# Patient Record
Sex: Male | Born: 1935 | Race: White | Hispanic: No | Marital: Married | Smoking: Former smoker
Health system: Southern US, Community
[De-identification: ages and names within clinical notes are randomized; demographics above are authoritative.]

## PROBLEM LIST (undated history)

## (undated) DIAGNOSIS — M899 Disorder of bone, unspecified: Secondary | ICD-10-CM

## (undated) DIAGNOSIS — I10 Essential (primary) hypertension: Secondary | ICD-10-CM

## (undated) DIAGNOSIS — C3490 Malignant neoplasm of unspecified part of unspecified bronchus or lung: Secondary | ICD-10-CM

## (undated) DIAGNOSIS — K219 Gastro-esophageal reflux disease without esophagitis: Secondary | ICD-10-CM

## (undated) DIAGNOSIS — K5909 Other constipation: Secondary | ICD-10-CM

## (undated) HISTORY — DX: Other constipation: K59.09

## (undated) HISTORY — DX: Gastro-esophageal reflux disease without esophagitis: K21.9

## (undated) HISTORY — DX: Malignant neoplasm of unspecified part of unspecified bronchus or lung: C34.90

## (undated) HISTORY — DX: Disorder of bone, unspecified: M89.9

## (undated) HISTORY — DX: Essential (primary) hypertension: I10

---

## 2007-02-16 ENCOUNTER — Ambulatory Visit: Payer: Self-pay | Admitting: Unknown Physician Specialty

## 2008-01-30 ENCOUNTER — Ambulatory Visit: Payer: Self-pay

## 2008-02-29 ENCOUNTER — Ambulatory Visit: Payer: Self-pay | Admitting: Orthopedic Surgery

## 2008-02-29 ENCOUNTER — Other Ambulatory Visit: Payer: Self-pay

## 2008-03-05 ENCOUNTER — Ambulatory Visit: Payer: Self-pay | Admitting: Orthopedic Surgery

## 2008-09-30 ENCOUNTER — Ambulatory Visit: Payer: Self-pay | Admitting: Cardiology

## 2009-11-19 ENCOUNTER — Ambulatory Visit: Payer: Self-pay | Admitting: Ophthalmology

## 2009-11-22 ENCOUNTER — Ambulatory Visit: Payer: Self-pay

## 2009-12-04 ENCOUNTER — Ambulatory Visit: Payer: Self-pay

## 2009-12-23 ENCOUNTER — Ambulatory Visit: Payer: Self-pay

## 2010-02-22 ENCOUNTER — Ambulatory Visit: Payer: Self-pay

## 2010-03-09 ENCOUNTER — Ambulatory Visit: Payer: Self-pay

## 2010-03-19 ENCOUNTER — Ambulatory Visit: Payer: Self-pay

## 2010-03-25 ENCOUNTER — Ambulatory Visit: Payer: Self-pay

## 2010-07-07 ENCOUNTER — Ambulatory Visit: Payer: Self-pay | Admitting: Chiropractic Medicine

## 2010-09-23 ENCOUNTER — Ambulatory Visit: Payer: Self-pay | Admitting: Family Medicine

## 2010-09-25 ENCOUNTER — Ambulatory Visit: Payer: Self-pay | Admitting: Internal Medicine

## 2012-10-04 ENCOUNTER — Ambulatory Visit: Payer: Self-pay | Admitting: Unknown Physician Specialty

## 2013-11-02 ENCOUNTER — Emergency Department: Payer: Self-pay | Admitting: Emergency Medicine

## 2013-11-02 ENCOUNTER — Ambulatory Visit: Payer: Self-pay

## 2013-11-02 LAB — CBC WITH DIFFERENTIAL/PLATELET
Basophil #: 0 10*3/uL (ref 0.0–0.1)
Basophil %: 0.4 %
EOS PCT: 1.2 %
Eosinophil #: 0.1 10*3/uL (ref 0.0–0.7)
HCT: 45 % (ref 40.0–52.0)
HGB: 15.4 g/dL (ref 13.0–18.0)
LYMPHS PCT: 11.1 %
Lymphocyte #: 1.2 10*3/uL (ref 1.0–3.6)
MCH: 29.6 pg (ref 26.0–34.0)
MCHC: 34.3 g/dL (ref 32.0–36.0)
MCV: 86 fL (ref 80–100)
Monocyte #: 1.2 x10 3/mm — ABNORMAL HIGH (ref 0.2–1.0)
Monocyte %: 11.4 %
NEUTROS PCT: 75.9 %
Neutrophil #: 8 10*3/uL — ABNORMAL HIGH (ref 1.4–6.5)
PLATELETS: 267 10*3/uL (ref 150–440)
RBC: 5.21 10*6/uL (ref 4.40–5.90)
RDW: 13.2 % (ref 11.5–14.5)
WBC: 10.6 10*3/uL (ref 3.8–10.6)

## 2013-11-02 LAB — BASIC METABOLIC PANEL
Anion Gap: 10 (ref 7–16)
BUN: 17 mg/dL (ref 7–18)
CHLORIDE: 100 mmol/L (ref 98–107)
CREATININE: 0.99 mg/dL (ref 0.60–1.30)
Calcium, Total: 8.3 mg/dL — ABNORMAL LOW (ref 8.5–10.1)
Co2: 27 mmol/L (ref 21–32)
EGFR (African American): >60
EGFR (Non-African Amer.): >60
Glucose: 109 mg/dL — ABNORMAL HIGH (ref 65–99)
Osmolality: 276 (ref 275–301)
POTASSIUM: 3.4 mmol/L — AB (ref 3.5–5.1)
SODIUM: 137 mmol/L (ref 136–145)

## 2013-11-02 LAB — D-DIMER(ARMC): D-Dimer: 499 ng/ml

## 2013-11-02 LAB — TROPONIN I: Troponin-I: <0.02 ng/mL

## 2013-12-19 ENCOUNTER — Ambulatory Visit: Payer: Self-pay | Admitting: Unknown Physician Specialty

## 2013-12-20 ENCOUNTER — Ambulatory Visit: Payer: Self-pay | Admitting: Hematology and Oncology

## 2013-12-23 ENCOUNTER — Ambulatory Visit: Payer: Self-pay | Admitting: Hematology and Oncology

## 2013-12-23 LAB — COMPREHENSIVE METABOLIC PANEL
ALT: 22 U/L (ref 12–78)
AST: 9 U/L — AB (ref 15–37)
Albumin: 3.5 g/dL (ref 3.4–5.0)
Alkaline Phosphatase: 82 U/L
Anion Gap: 5 — ABNORMAL LOW (ref 7–16)
BILIRUBIN TOTAL: 0.5 mg/dL (ref 0.2–1.0)
BUN: 13 mg/dL (ref 7–18)
CO2: 32 mmol/L (ref 21–32)
Calcium, Total: 8.6 mg/dL (ref 8.5–10.1)
Chloride: 103 mmol/L (ref 98–107)
Creatinine: 0.95 mg/dL (ref 0.60–1.30)
Glucose: 97 mg/dL (ref 65–99)
Osmolality: 279 (ref 275–301)
Potassium: 4.1 mmol/L (ref 3.5–5.1)
Sodium: 140 mmol/L (ref 136–145)
TOTAL PROTEIN: 6.5 g/dL (ref 6.4–8.2)

## 2013-12-23 LAB — CBC CANCER CENTER
Basophil #: 0.1 x10 3/mm (ref 0.0–0.1)
Basophil %: 0.9 %
Eosinophil #: 0.3 x10 3/mm (ref 0.0–0.7)
Eosinophil %: 3.9 %
HCT: 44.2 % (ref 40.0–52.0)
HGB: 14.7 g/dL (ref 13.0–18.0)
LYMPHS PCT: 21.1 %
Lymphocyte #: 1.7 x10 3/mm (ref 1.0–3.6)
MCH: 29.4 pg (ref 26.0–34.0)
MCHC: 33.3 g/dL (ref 32.0–36.0)
MCV: 88 fL (ref 80–100)
Monocyte #: 0.7 x10 3/mm (ref 0.2–1.0)
Monocyte %: 8.9 %
NEUTROS ABS: 5.2 x10 3/mm (ref 1.4–6.5)
Neutrophil %: 65.2 %
PLATELETS: 332 x10 3/mm (ref 150–440)
RBC: 5.01 10*6/uL (ref 4.40–5.90)
RDW: 13.2 % (ref 11.5–14.5)
WBC: 8 x10 3/mm (ref 3.8–10.6)

## 2013-12-23 LAB — PROTIME-INR
INR: 1.1
Prothrombin Time: 13.6 secs (ref 11.5–14.7)

## 2013-12-23 LAB — LACTATE DEHYDROGENASE: LDH: 125 U/L (ref 85–241)

## 2013-12-24 LAB — PSA: PSA: 2.8 ng/mL (ref 0.0–4.0)

## 2013-12-30 DIAGNOSIS — M899 Disorder of bone, unspecified: Secondary | ICD-10-CM

## 2013-12-30 HISTORY — DX: Disorder of bone, unspecified: M89.9

## 2013-12-31 ENCOUNTER — Ambulatory Visit
Admission: RE | Admit: 2013-12-31 | Discharge: 2013-12-31 | Disposition: A | Discharge status: Home / Self Care | Payer: Medicare Other | Source: Ambulatory Visit | Attending: Cardiothoracic Surgery | Admitting: Cardiothoracic Surgery

## 2013-12-31 ENCOUNTER — Other Ambulatory Visit: Payer: Self-pay

## 2013-12-31 ENCOUNTER — Encounter: Payer: Self-pay | Admitting: Cardiothoracic Surgery

## 2013-12-31 ENCOUNTER — Institutional Professional Consult (permissible substitution) (INDEPENDENT_AMBULATORY_CARE_PROVIDER_SITE_OTHER): Payer: Medicare Other | Admitting: Cardiothoracic Surgery

## 2013-12-31 VITALS — BP 134/89 | HR 64 | Resp 16 | Ht 67.0 in | Wt 188.0 lb

## 2013-12-31 DIAGNOSIS — M899 Disorder of bone, unspecified: Secondary | ICD-10-CM

## 2013-12-31 DIAGNOSIS — I10 Essential (primary) hypertension: Secondary | ICD-10-CM

## 2013-12-31 DIAGNOSIS — I4891 Unspecified atrial fibrillation: Secondary | ICD-10-CM

## 2013-12-31 DIAGNOSIS — D381 Neoplasm of uncertain behavior of trachea, bronchus and lung: Secondary | ICD-10-CM

## 2013-12-31 DIAGNOSIS — M949 Disorder of cartilage, unspecified: Secondary | ICD-10-CM

## 2013-12-31 NOTE — Progress Notes (Signed)
[address]       [place] [address]             [phone]                    [name] Medical Record #[id number] Date of Birth: 1933/01/27  Referring: Nestor Lewandowsky, MD Primary Care: No primary provider on file.  Chief Complaint:    Chief Complaint  Patient presents with  . Lung Lesion    Surgical eval on left rib lesion, MRI 12/19/13    History of Present Illness:    Tanner Stone 80 y.o. male is seen in the office  today for a destructive lesion in the left seventh rib. The patient has noted vague discomfort intermittently over the left posterior rib for approximately a year. In February 2015 while splitting wood he had the sudden increase in the pain and did not resolve he was treated for "pulled muscle" for repair time without any improvement. He was seen in the [facility] emergency room for chest wall pain and gastric distress related to nonsteroidal anti-inflammatories he was taking. Because of continued pain an MRI of the thoracic spine was performed 3 weeks ago. The patient is referred to the office for consideration of further scanning and surgical treatment. The patient has had an 18pound weight loss since February. He is a nonsmoker does not use alcohol.      Current Activity/ Functional Status:  Patient is independent with mobility/ambulation, transfers, ADL's, IADL's.   Zubrod Score: At the time of surgery this patient's most appropriate activity status/level should be described as: [x]     0    Normal activity, no symptoms []     1    Restricted in physical strenuous activity but ambulatory, able to do out light work []     2    Ambulatory and capable of self care, unable to do work activities, up and about               >50 % of waking hours                              []     3    Only limited self care, in bed greater than 50% of waking hours []     4    Completely disabled, no self care, confined to bed or chair []     5     Moribund   Past Medical History  Diagnosis Date  . Hypertension   . Constipation, chronic   . GERD (gastroesophageal reflux disease)   . Rib lesion 12/30/2013   Surgical History:  Right shoulder surgery Appendectomy at age 28 Right inguinal hernia repair  Family History:  Patient's family history is significant for 3 children oldest on recently at age 47 had testicular cancer Patient's mother died of dementia at age 66 Father died at age 27 with melanoma A sister died of lung cancer at age 33 A brother died of colon cancer at age 25  History   Social History  . Marital Status: Married    Spouse Name: N/A    Number of Children: 3  . Years of Education: N/A   Occupational History  . Not on file.   Social History Main Topics  . Smoking status: Former Smoker -- 1.00 packs/day for 4 years    Types: Cigarettes  Quit date: 01/01/1960  . Smokeless tobacco: Never Used  . Alcohol Use: No  . Drug Use: No  .        History  Smoking status  . Former Smoker -- 1.00 packs/day for 4 years  . Types: Cigarettes  . Quit date: 01/01/1960  Smokeless tobacco  . Never Used    History  Alcohol Use No     Allergies  Allergen Reactions  . Dairy Aid [Lactase] Other (See Comments)    ALL Dairy Products/ GI distress    Current Outpatient Prescriptions  Medication Sig Dispense Refill  . aspirin EC 81 MG tablet Take 81 mg by mouth daily.      Marland Kitchen diltiazem (CARDIZEM SR) 120 MG 12 hr capsule Take 120 mg by mouth daily.      Marland Kitchen HYDROcodone-acetaminophen (NORCO/VICODIN) 5-325 MG per tablet Take 1 tablet by mouth every 6 (six) hours as needed for moderate pain.      Marland Kitchen losartan (COZAAR) 100 MG tablet Take 100 mg by mouth daily.      Marland Kitchen omeprazole (PRILOSEC) 20 MG capsule Take 20 mg by mouth daily.      Marland Kitchen senna-docusate (SENOKOT-S) 8.6-50 MG per tablet Take 1 tablet by mouth at bedtime.       No current facility-administered medications for this visit.     Review of Systems:       Cardiac Review of Systems: Y or N  Chest Pain [ y left chest wall   ]  Resting SOB [ n  ] Exertional SOB  [ n ]  Orthopnea [ n ]   Pedal Edema [n   ]    Palpitations [ y ] Syncope  [n  ]   Presyncope [n   ]  General Review of Systems: [Y] = yes [  ]=no Constitional: recent weight change Blue.Reese  ];  Wt loss over the last 3 months Roice.Felt   ] anorexia [  ]; fatigue [  ]; nausea [  ]; night sweats [  ]; fever [  ]; or chills [  ];          Dental: poor dentition[ y dentures ]; Last Dentist visit:   Eye : blurred vision [ n ]; diplopia [   ]; vision changes [  ];  Amaurosis fugax[  ]; Resp: cough [n  ];  wheezing[n  ];  hemoptysis[ n ]; shortness of breath[n  ]; paroxysmal nocturnal dyspnea[  ]; dyspnea on exertion[  ]; or orthopnea[  ];  GI:  gallstones[ n ], vomiting[n  ];  dysphagia[  ]; melena[  ];  hematochezia [  ]; heartburn[  ];   Hx of  Colonoscopy[ y ]; GU: kidney stones [  ]; hematuria[ n ];   dysuria [ n ];  nocturia[  ];  history of     obstruction [  ]; urinary frequency [ n ]             Skin: rash, swelling[  ];, hair loss[  ];  peripheral edema[  ];  or itching[  ]; Musculosketetal: myalgias[  ];  joint swelling[ n ];  joint erythema[ n ];  joint pain[  ];  back pain[y  ];  Heme/Lymph: bruising[n  ];  bleeding[  ];  anemia[  ];  Neuro: TIA[  ];  headaches[  ];  stroke[  ];  vertigo[  ];  seizures[  ];   paresthesias[  ];  difficulty walking[  ];  Psych:depression[  ];  anxiety[  ];  Endocrine: diabetes[  ];  thyroid dysfunction[  ];  Immunizations: Flu up to date [ y ]; Pneumococcal up to date Blue.Reese  ];  Other:  Physical Exam: BP 134/89  Pulse 64  Resp 16  Ht 5\' 7"  (1.702 m)  Wt 188 lb (85.276 kg)  BMI 29.44 kg/m2  SpO2 97%  PHYSICAL EXAMINATION:  General appearance: alert, cooperative, appears stated age and mild distress Neurologic: intact Heart: Irregularly irregular rate and rhythm, S1, S2 normal, no murmur, click, rub or gallop Lungs: clear to auscultation bilaterally and  normal percussion bilaterally Abdomen: soft, non-tender; bowel sounds normal; no masses,  no organomegaly Extremities: extremities normal, atraumatic, no cyanosis or edema and Homans sign is negative, no sign of DVT Mass palpable left posterior chest wall approximately 6-7 rib does not appear to involve the movement of the scapula   Diagnostic Studies & Laboratory data:     Recent Radiology Findings:   <HTML><META HTTP-EQUIV="content-type" CONTENT="text/html;charset=utf-8"><P>CLINICAL DATA: Mid back pain for 4 months.<BR> <BR>EXAM:<BR>MRI THORACIC SPINE WITHOUT CONTRAST<BR> <BR>TECHNIQUE:<BR>Multiplanar, multisequence MR imaging of the thoracic spine was<BR>performed. No intravenous contrast was administered.<BR> <BR>COMPARISON: None.<BR> <BR>FINDINGS:<BR>There is a larger lesion emanating from scratch there is a large<BR>lesion centered in the posterior left chest wall, likely the left<BR>sixth rib. The lesion measures 4.3 cm transverse by 4.0 cm AP by 7.6<BR>cm craniocaudal. The lesion is predominantly isointense to muscle on<BR>T1 weighted imaging with areas of intermediate and increased T2<BR>signal. No other focal bony lesion is identified. Small mucous<BR>retention cyst or polyp in the floor of the left maxillary sinus is<BR>incidentally noted.<BR> <BR>Vertebral body height and alignment are maintained. Degenerative<BR>endplate signal change is seen at L1-2. Bone marrow signal in the<BR>thoracic spine is otherwise unremarkable. The thoracic cord<BR>demonstrates normal signal throughout. The central spinal canal<BR>neural foramina appear open at all levels. Scattered very shallow<BR>disc bulges are also identified.<BR> <BR>IMPRESSION:<BR>Large destructive rib lesion on the left, likely centered in the<BR>seventh rib. Main differential considerations are chondrosarcoma<BR>versus metastatic disease. 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 Arlana Lindau obtain a CT scan of the chest following this decide whether to proceed with attempted needle biopsy versus primary resection. CT scan will be done today if possible I will see the patient back on Thursday.   I spent 55 minutes counseling the patient face to face. The total time spent in the appointment was 60 minutes.  Grace Isaac MD      [address] [place] [address] Office [phone]   Beeper [phone]  12/31/2013 1:57 PM

## 2014-01-01 ENCOUNTER — Encounter: Payer: Medicare Other | Admitting: Cardiothoracic Surgery

## 2014-01-01 ENCOUNTER — Other Ambulatory Visit: Payer: Self-pay

## 2014-01-01 DIAGNOSIS — D381 Neoplasm of uncertain behavior of trachea, bronchus and lung: Secondary | ICD-10-CM

## 2014-01-02 ENCOUNTER — Encounter: Payer: Self-pay | Admitting: Cardiothoracic Surgery

## 2014-01-02 ENCOUNTER — Other Ambulatory Visit: Payer: Self-pay | Admitting: *Deleted

## 2014-01-02 ENCOUNTER — Ambulatory Visit (INDEPENDENT_AMBULATORY_CARE_PROVIDER_SITE_OTHER): Payer: Medicare Other | Admitting: Cardiothoracic Surgery

## 2014-01-02 VITALS — BP 152/90 | HR 78 | Resp 20 | Ht 67.0 in | Wt 188.0 lb

## 2014-01-02 DIAGNOSIS — D381 Neoplasm of uncertain behavior of trachea, bronchus and lung: Secondary | ICD-10-CM

## 2014-01-02 DIAGNOSIS — M899 Disorder of bone, unspecified: Secondary | ICD-10-CM

## 2014-01-02 DIAGNOSIS — R918 Other nonspecific abnormal finding of lung field: Secondary | ICD-10-CM

## 2014-01-02 DIAGNOSIS — M949 Disorder of cartilage, unspecified: Secondary | ICD-10-CM

## 2014-01-02 DIAGNOSIS — C3490 Malignant neoplasm of unspecified part of unspecified bronchus or lung: Secondary | ICD-10-CM | POA: Insufficient documentation

## 2014-01-02 HISTORY — DX: Malignant neoplasm of unspecified part of unspecified bronchus or lung: C34.90

## 2014-01-02 NOTE — Progress Notes (Signed)
[address]       [place] [address]             [phone]                    Jaze M [facility] Medical Record #[id number] Date of Birth: [birth year]  Referring: Nestor Lewandowsky, MD Primary Care: No primary provider on file.  Chief Complaint:    Chief Complaint  Patient presents with  . Follow-up    Discuss Chest CT 12/31/13    History of Present Illness:    Tanner Stone 80 y.o. male is seen in the office  today for a destructive lesion in the left seventh rib. The patient has noted vague discomfort intermittently over the left posterior rib for approximately a year. In February 2015 while splitting wood he had the sudden increase in the pain and did not resolve he was treated for "pulled muscle" for repair time without any improvement. He was seen in the [facility] emergency room for chest wall pain and gastric distress related to nonsteroidal anti-inflammatories he was taking. Because of continued pain an MRI of the thoracic spine was performed 3 weeks ago. The patient is referred to the office for consideration of further scanning and surgical treatment. The patient has had an 18 pound weight loss since February. He is a nonsmoker does not use alcohol.   Since I saw the patient 2 days ago CT scan of the chest was performed and results noted below, the patient has the known destructive lesion of the left sixth/seventh rib. In addition lung nodules are noted.   Current Activity/ Functional Status:  Patient is independent with mobility/ambulation, transfers, ADL's, IADL's.   Zubrod Score: At the time of surgery this patient's most appropriate activity status/level should be described as: [x]     0    Normal activity, no symptoms []     1    Restricted in physical strenuous activity but ambulatory, able to do out light work []     2    Ambulatory and capable of self care, unable to do work activities, up and about               >50 % of waking hours                               []     3    Only limited self care, in bed greater than 50% of waking hours []     4    Completely disabled, no self care, confined to bed or chair []     5    Moribund   Past Medical History  Diagnosis Date  . Hypertension   . Constipation, chronic   . GERD (gastroesophageal reflux disease)   . Rib lesion 12/30/2013   Surgical History:  Right shoulder surgery Appendectomy at age 50 Right inguinal hernia repair  Family History:  Patient's family history is significant for 3 children oldest on recently at age 13 had testicular cancer Patient's mother died of dementia at age 67 Father died at age 33 with melanoma A sister died of lung cancer at age 31 A brother died of colon cancer at age 68  History   Social History  . Marital Status: Married    Spouse Name: N/A    Number of Children: 3  . Years of Education: N/A   Occupational  History  . Not on file.   Social History Main Topics  . Smoking status: Former Smoker -- 1.00 packs/day for 4 years    Types: Cigarettes    Quit date: 01/01/1960  . Smokeless tobacco: Never Used  . Alcohol Use: No  . Drug Use: No  .        History  Smoking status  . Former Smoker -- 1.00 packs/day for 4 years  . Types: Cigarettes  . Quit date: 01/01/1960  Smokeless tobacco  . Never Used    History  Alcohol Use No     Allergies  Allergen Reactions  . Dairy Aid [Lactase] Other (See Comments)    ALL Dairy Products/ GI distress    Current Outpatient Prescriptions  Medication Sig Dispense Refill  . aspirin EC 81 MG tablet Take 81 mg by mouth daily.      Marland Kitchen diltiazem (CARDIZEM SR) 120 MG 12 hr capsule Take 120 mg by mouth daily.      Marland Kitchen HYDROcodone-acetaminophen (NORCO/VICODIN) 5-325 MG per tablet Take 1 tablet by mouth every 6 (six) hours as needed for moderate pain.      Marland Kitchen losartan (COZAAR) 100 MG tablet Take 100 mg by mouth daily.      Marland Kitchen omeprazole (PRILOSEC) 20 MG capsule Take 20 mg by mouth daily.      Marland Kitchen  senna-docusate (SENOKOT-S) 8.6-50 MG per tablet Take 1 tablet by mouth at bedtime.       No current facility-administered medications for this visit.     Review of Systems:     Cardiac Review of Systems: Y or N  Chest Pain [ y left chest wall   ]  Resting SOB [ n  ] Exertional SOB  [ n ]  Orthopnea [ n ]   Pedal Edema [n   ]    Palpitations [ y ] Syncope  [n  ]   Presyncope [n   ]  General Review of Systems: [Y] = yes [  ]=no Constitional: recent weight change Blue.Reese  ];  Wt loss over the last 3 months Roice.Felt   ] anorexia [  ]; fatigue [  ]; nausea [  ]; night sweats [  ]; fever [  ]; or chills [  ];          Dental: poor dentition[ y dentures ]; Last Dentist visit:   Eye : blurred vision [ n ]; diplopia [   ]; vision changes [  ];  Amaurosis fugax[  ]; Resp: cough [n  ];  wheezing[n  ];  hemoptysis[ n ]; shortness of breath[n  ]; paroxysmal nocturnal dyspnea[  ]; dyspnea on exertion[  ]; or orthopnea[  ];  GI:  gallstones[ n ], vomiting[n  ];  dysphagia[  ]; melena[  ];  hematochezia [  ]; heartburn[  ];   Hx of  Colonoscopy[ y ]; GU: kidney stones [  ]; hematuria[ n ];   dysuria [ n ];  nocturia[  ];  history of     obstruction [  ]; urinary frequency [ n ]             Skin: rash, swelling[  ];, hair loss[  ];  peripheral edema[  ];  or itching[  ]; Musculosketetal: myalgias[  ];  joint swelling[ n ];  joint erythema[ n ];  joint pain[  ];  back pain[y  ];  Heme/Lymph: bruising[n  ];  bleeding[  ];  anemia[  ];  Neuro:  TIA[  ];  headaches[  ];  stroke[  ];  vertigo[  ];  seizures[  ];   paresthesias[  ];  difficulty walking[  ];  Psych:depression[  ]; anxiety[  ];  Endocrine: diabetes[  ];  thyroid dysfunction[  ];  Immunizations: Flu up to date [ y ]; Pneumococcal up to date Blue.Reese  ];  Other:  Physical Exam: BP 152/90  Pulse 78  Resp 20  Ht 5\' 7"  (1.702 m)  Wt 188 lb (85.276 kg)  BMI 29.44 kg/m2  SpO2 97%  PHYSICAL EXAMINATION:  General appearance: alert, cooperative, appears stated  age and mild distress Neurologic: intact Heart: Irregularly irregular rate and rhythm, S1, S2 normal, no murmur, click, rub or gallop Lungs: clear to auscultation bilaterally and normal percussion bilaterally Abdomen: soft, non-tender; bowel sounds normal; no masses,  no organomegaly Extremities: extremities normal, atraumatic, no cyanosis or edema and Homans sign is negative, no sign of DVT Mass palpable left posterior chest wall approximately 6-7 rib does not appear to involve the movement of the scapula   Diagnostic Studies & Laboratory data:     Recent Radiology Findings:  Ct Chest Wo Contrast  12/31/2013   CLINICAL DATA:  Left-sided rib sarcoma.  Followup.  Possible preop.  EXAM: CT CHEST WITHOUT CONTRAST  TECHNIQUE: Multidetector CT imaging of the chest was performed following the standard protocol without IV contrast.  COMPARISON:  MRI of 12/19/2013.  No prior chest CT.  FINDINGS: Lungs/Pleura: Lingular nodule which measures 1.0 x 1.0 cm on image 41/series 4 and sagittal image 133.  Minimal left major fissure oral thickening/ nodularity on image 32.  Superior segment left lower lobe volume loss adjacent to the rib process detailed below.  No pleural fluid.  Pleural involvement of the chest wall/rib lesion.  Separate area of mild left pleural thickening and nodularity on image 43/series 3 and less so on image 37/series 3.  Heart/Mediastinum: No supraclavicular adenopathy. Aortic and branch vessel atherosclerosis. Tortuous descending thoracic aorta. Mild cardiomegaly, without pericardial effusion.  No mediastinal or definite hilar adenopathy, given limitations of unenhanced CT.  Upper Abdomen: Normal noncontrast appearance of the imaged liver, spleen, stomach, pancreas, gallbladder, biliary tract, adrenal glands. Suspect left-sided renal sinus cysts. Lower pole left renal collecting system calculus.  Bones/Musculoskeletal: Soft tissue mass centered about the sixth and seventh posterior medial left  ribs. Better evaluated on recent MRI. Measures on the order of 4.1 x 5.4 cm on image 28/series 3. Concurrent sixth and seventh rib destruction or involvement.  Sclerotic lesion involving the fifth anterior left rib on image 34 is favored to represent a bone island.  IMPRESSION: 1. Left chest wall/rib based mass, consistent with the clinical history of sarcoma. 2. Isolated lingular nodule, suspicious for pulmonary metastasis. A synchronous primary bronchogenic carcinoma could look similar. Tissue sampling and/or PET should be considered. 3. Foci of pleural nodularity inferior to the chest wall/rib mass. Cannot exclude pleural metastasis. This would also be better evaluated with PET. 4. Left nephrolithiasis.   Electronically Signed   By: Abigail Miyamoto M.D.   On: 12/31/2013 16:05      <HTML><META HTTP-EQUIV="content-type" CONTENT="text/html;charset=utf-8"><P>CLINICAL DATA: Mid back pain for 4 months.<BR> <BR>EXAM:<BR>MRI THORACIC SPINE WITHOUT CONTRAST<BR> <BR>TECHNIQUE:<BR>Multiplanar, multisequence MR imaging of the thoracic spine was<BR>performed. No intravenous contrast was administered.<BR> <BR>COMPARISON: None.<BR> <BR>FINDINGS:<BR>There is a larger lesion emanating from scratch there is a large<BR>lesion centered in the posterior left chest wall, likely the left<BR>sixth rib. The lesion measures 4.3 cm transverse by 4.0 cm AP by  7.6<BR>cm craniocaudal. The lesion is predominantly isointense to muscle on<BR>T1 weighted imaging with areas of intermediate and increased T2<BR>signal. No other focal bony lesion is identified. Small mucous<BR>retention cyst or polyp in the floor of the left maxillary sinus is<BR>incidentally noted.<BR> <BR>Vertebral body height and alignment are maintained. Degenerative<BR>endplate signal change is seen at L1-2. Bone marrow signal in the<BR>thoracic spine is otherwise unremarkable. The thoracic cord<BR>demonstrates normal signal throughout. The central spinal canal<BR>neural  foramina appear open at all levels. Scattered very shallow<BR>disc bulges are also identified.<BR> <BR>IMPRESSION:<BR>Large destructive rib lesion on the left, likely centered in the<BR>seventh rib. Main differential considerations are chondrosarcoma<BR>versus metastatic disease. Metastatic disease is thought less<BR>likely.<BR> <BR>These results will be called to the ordering clinician or<BR>representative by the Radiologist Assistant, and communication<BR>documented in the PACS or zVision Dashboard.<BR> <BR> <BR>Electronically Signed<BR>By: Inge Rise M.D.<BR>On: 12/19/2013 08:55<BR></P>  Recent Lab Findings: No results found for this basename: WBC,  HGB,  HCT,  PLT,  GLUCOSE,  CHOL,  TRIG,  HDL,  LDLDIRECT,  LDLCALC,  ALT,  AST,  NA,  K,  CL,  CREATININE,  BUN,  CO2,  TSH,  INR,  GLUF,  HGBA1C      Assessment / Plan:   Left chest wall mass distractive seventh rib lesion 4 x 7.6 cm in size, 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 The final determination and this will be based on the PET scan June 15. Following a known pathologic diagnosis and stage further treatment modalities can be arranged.  Probable atrial fibrillation on physical exam-confirmation of the patient's rhythm and possible need for anticoagulation long term-patient's previously to see Dr. Kyra Searles  Tomorrow   Grace Isaac MD      [address] ,[place] [address] Office [phone]   Beeper [phone]  01/02/2014 12:26 PM

## 2014-01-06 ENCOUNTER — Ambulatory Visit: Payer: Self-pay | Admitting: Cardiothoracic Surgery

## 2014-01-08 ENCOUNTER — Other Ambulatory Visit: Payer: Self-pay | Admitting: Radiology

## 2014-01-09 ENCOUNTER — Encounter (HOSPITAL_COMMUNITY): Payer: Self-pay | Admitting: Pharmacist

## 2014-01-09 ENCOUNTER — Other Ambulatory Visit: Payer: Self-pay | Admitting: Radiology

## 2014-01-10 ENCOUNTER — Encounter (HOSPITAL_COMMUNITY): Payer: Self-pay

## 2014-01-10 ENCOUNTER — Ambulatory Visit (HOSPITAL_COMMUNITY)
Admission: RE | Admit: 2014-01-10 | Discharge: 2014-01-10 | Disposition: A | Discharge status: Home / Self Care | Payer: Medicare Other | Source: Ambulatory Visit | Attending: Cardiothoracic Surgery | Admitting: Cardiothoracic Surgery

## 2014-01-10 DIAGNOSIS — R911 Solitary pulmonary nodule: Secondary | ICD-10-CM | POA: Diagnosis not present

## 2014-01-10 DIAGNOSIS — R222 Localized swelling, mass and lump, trunk: Secondary | ICD-10-CM | POA: Diagnosis present

## 2014-01-10 DIAGNOSIS — R918 Other nonspecific abnormal finding of lung field: Secondary | ICD-10-CM

## 2014-01-10 LAB — PROTIME-INR
INR: 1.08 (ref 0.00–1.49)
PROTHROMBIN TIME: 13.8 s (ref 11.6–15.2)

## 2014-01-10 LAB — CBC
HEMATOCRIT: 46.2 % (ref 39.0–52.0)
Hemoglobin: 15.7 g/dL (ref 13.0–17.0)
MCH: 29.6 pg (ref 26.0–34.0)
MCHC: 34 g/dL (ref 30.0–36.0)
MCV: 87.2 fL (ref 78.0–100.0)
PLATELETS: 331 10*3/uL (ref 150–400)
RBC: 5.3 MIL/uL (ref 4.22–5.81)
RDW: 12.9 % (ref 11.5–15.5)
WBC: 11.8 10*3/uL — ABNORMAL HIGH (ref 4.0–10.5)

## 2014-01-10 LAB — APTT: APTT: 29 s (ref 24–37)

## 2014-01-10 MED ORDER — LIDOCAINE-EPINEPHRINE 1 %-1:100000 IJ SOLN
INTRAMUSCULAR | Status: AC
  Filled 2014-01-10: qty 1

## 2014-01-10 MED ORDER — FENTANYL CITRATE 0.05 MG/ML IJ SOLN
INTRAMUSCULAR | Status: AC
  Filled 2014-01-10: qty 2

## 2014-01-10 MED ORDER — MIDAZOLAM HCL 2 MG/2ML IJ SOLN
INTRAMUSCULAR | Status: AC
  Filled 2014-01-10: qty 4

## 2014-01-10 MED ORDER — SODIUM CHLORIDE 0.9 % IV SOLN
Freq: Once | INTRAVENOUS | Status: AC
  Administered 2014-01-10: 09:00:00 via INTRAVENOUS

## 2014-01-10 MED ORDER — FENTANYL CITRATE 0.05 MG/ML IJ SOLN
INTRAMUSCULAR | Status: AC | PRN
  Administered 2014-01-10: 25 ug via INTRAVENOUS
  Administered 2014-01-10: 50 ug via INTRAVENOUS

## 2014-01-10 MED ORDER — MIDAZOLAM HCL 2 MG/2ML IJ SOLN
INTRAMUSCULAR | Status: AC | PRN
Start: 2014-01-10 — End: 2014-01-10
  Administered 2014-01-10: 0.5 mg via INTRAVENOUS
  Administered 2014-01-10: 1 mg via INTRAVENOUS

## 2014-01-10 NOTE — H&P (Signed)
Tanner Stone is an 80 y.o. male.   Chief Complaint: Pt had noticed off and on L shoulder and back pain almost 1 yr Then 08/2013 while chopping wood noted new pain L shoulder Persistent even with medication After continual pain - pt did have MRI performed 3-4 weeks ago and referred to Dr Servando Snare. Further work up included CT scan which reveals L chest wall/rib based lesion and lingular nodule. PET at [facility] 01/06/14 has been reviewed by Dr Pascal Lux Also noted wt loss per pt. Now scheduled for L chest wall mass biopsy  HPI: HTN; GERD; wt loss  Past Medical History  Diagnosis Date  . Hypertension   . Constipation, chronic   . GERD (gastroesophageal reflux disease)   . Rib lesion 12/30/2013    History reviewed. No pertinent past surgical history.  No family history on file. Social History:  reports that he quit smoking about 54 years ago. His smoking use included Cigarettes. He has a 4 pack-year smoking history. He has never used smokeless tobacco. He reports that he does not drink alcohol or use illicit drugs.  Allergies:  Allergies  Allergen Reactions  . Dairy Aid [Lactase] Other (See Comments)    ALL Dairy Products/ GI distress     (Not in a hospital admission)  Results for orders placed during the hospital encounter of 01/10/14 (from the past 48 hour(s))  APTT     Status: None   Collection Time    01/10/14  9:19 AM      Result Value Ref Range   aPTT 29  24 - 37 seconds  CBC     Status: Abnormal   Collection Time    01/10/14  9:19 AM      Result Value Ref Range   WBC 11.8 (*) 4.0 - 10.5 K/uL   RBC 5.30  4.22 - 5.81 MIL/uL   Hemoglobin 15.7  13.0 - 17.0 g/dL   HCT 46.2  39.0 - 52.0 %   MCV 87.2  78.0 - 100.0 fL   MCH 29.6  26.0 - 34.0 pg   MCHC 34.0  30.0 - 36.0 g/dL   RDW 12.9  11.5 - 15.5 %   Platelets 331  150 - 400 K/uL  PROTIME-INR     Status: None   Collection Time    01/10/14  9:19 AM      Result Value Ref Range   Prothrombin Time 13.8  11.6 - 15.2  seconds   INR 1.08  0.00 - 1.49   No results found.  Review of Systems  Constitutional: Positive for weight loss. Negative for fever.  Respiratory: Negative for cough and shortness of breath.   Cardiovascular: Positive for chest pain.  Gastrointestinal: Negative for nausea, vomiting and abdominal pain.  Musculoskeletal: Positive for back pain.  Neurological: Negative for weakness and headaches.  Psychiatric/Behavioral: Negative for substance abuse.    Blood pressure 138/94, pulse 72, temperature 97.8 F (36.6 C), temperature source Oral, resp. rate 20, height 5\' 7"  (1.702 m), weight 84.823 kg (187 lb), SpO2 95.00%. Physical Exam  Constitutional: He is oriented to person, place, and time. He appears well-nourished.  Cardiovascular:  No murmur heard. Irregular  Respiratory: Effort normal and breath sounds normal. He has no wheezes.  GI: Soft. Bowel sounds are normal. There is no tenderness.  Musculoskeletal: Normal range of motion.  Neurological: He is alert and oriented to person, place, and time.  Skin: Skin is warm and dry.  Psychiatric: He has a normal  mood and affect. His behavior is normal. Judgment and thought content normal.     Assessment/Plan L chest wall/rib lesion mass Lingular mass Wt loss; L shoulder and back pain Now scheduled for chest wall mass bx Pt and wife aware of procedure benefits and risks and agreeable to proceed Consent signed and in chart   TURPIN,PAMELA A 01/10/2014, 10:32 AM

## 2014-01-10 NOTE — Sedation Documentation (Signed)
Patient denies pain and is resting comfortably.  

## 2014-01-10 NOTE — Discharge Instructions (Signed)
Needle Biopsy °Care After °These instructions give you information on caring for yourself after your procedure. Your doctor may also give you more specific instructions. Call your doctor if you have any problems or questions after your procedure. °HOME CARE °· Rest for 4 hours after your biopsy, except for getting up to go to the bathroom or as told. °· Keep the places where the needles were put in clean and dry. °¨ Do not put powder or lotion on the sites. °¨ Do not shower until 24 hours after the test. Remove all bandages (dressings) before showering. °¨ Remove all bandages at least once every day. Gently clean the sites with soap and water. Keep putting a new bandage on until the skin is closed. °Finding out the results of your test °Ask your doctor when your test results will be ready. Make sure you follow up and get the test results. °GET HELP RIGHT AWAY IF:  °· You have shortness of breath or trouble breathing. °· You have pain or cramping in your belly (abdomen). °· You feel sick to your stomach (nauseous) or throw up (vomit). °· Any of the places where the needles were put in: °¨ Are puffy (swollen) or red. °¨ Are sore or hot to the touch. °¨ Are draining yellowish-white fluid (pus). °¨ Are bleeding after 10 minutes of pressing down on the site. Have someone keep pressing on any place that is bleeding until you see a doctor. °· You have any unusual pain that will not stop. °· You have a fever. °If you go to the emergency room, tell the nurse that you had a biopsy. Take this paper with you to show the nurse. °MAKE SURE YOU:  °· Understand these instructions. °· Will watch your condition. °· Will get help right away if you are not doing well or get worse. °Document Released: 06/23/2008 Document Revised: 10/03/2011 Document Reviewed: 06/23/2008 °ExitCare® Patient Information ©2015 [employer]. This information is not intended to replace advice given to you by your health care provider. Make sure you discuss  any questions you have with your health care provider. ° °

## 2014-01-10 NOTE — Procedures (Signed)
Technically successful CT guided biopsy of expansile left posterior chest wall mass.  No immediate post procedural complications.

## 2014-01-13 ENCOUNTER — Other Ambulatory Visit (HOSPITAL_COMMUNITY): Payer: Medicare Other

## 2014-01-16 ENCOUNTER — Encounter: Payer: Self-pay | Admitting: *Deleted

## 2014-01-17 ENCOUNTER — Encounter: Payer: Self-pay | Admitting: *Deleted

## 2014-01-21 ENCOUNTER — Ambulatory Visit (INDEPENDENT_AMBULATORY_CARE_PROVIDER_SITE_OTHER): Payer: Medicare Other | Admitting: Cardiothoracic Surgery

## 2014-01-21 ENCOUNTER — Encounter: Payer: Self-pay | Admitting: Cardiothoracic Surgery

## 2014-01-21 VITALS — BP 140/90 | HR 60 | Resp 20 | Ht 67.0 in | Wt 187.0 lb

## 2014-01-21 DIAGNOSIS — Z9889 Other specified postprocedural states: Secondary | ICD-10-CM

## 2014-01-21 NOTE — Progress Notes (Signed)
[address]       [place] [address]             [phone]                    Finley M Dura Hancock Medical Record #[id number] Date of Birth: 1932-12-19  Referring: Nestor Lewandowsky, MD Primary Care: No primary provider on file.  Chief Complaint:    No chief complaint on file.   History of Present Illness:    Tanner Stone 80 y.o. male is seen in the office  today for a destructive lesion in the left seventh rib. The patient has noted vague discomfort intermittently over the left posterior rib for approximately a year. In February 2015 while splitting wood he had the sudden increase in the pain and did not resolve he was treated for "pulled muscle" for repair time without any improvement. He was seen in the [facility] emergency room for chest wall pain and gastric distress related to nonsteroidal anti-inflammatories he was taking. Because of continued pain an MRI of the thoracic spine was performed 3 weeks ago. The patient is referred to the office for consideration of further scanning and surgical treatment. The patient has had an 18 pound weight loss since February. He is a nonsmoker does not use alcohol.   Since patient was last seen a needle biopsy of the rib lesion has been performed and sent to the [facility] clinic for review [place] path number [phone] [phone]. 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  Current Activity/ Functional Status:  Patient is independent with mobility/ambulation, transfers, ADL's, IADL's.   Zubrod Score: At the time of surgery this patient's most appropriate activity status/level should be described as: [x]     0    Normal activity, no symptoms []     1    Restricted in physical strenuous activity but ambulatory, able to do out light work []     2    Ambulatory and capable of self care, unable  to do work activities, up and about               >50 % of waking hours                              []     3    Only limited self care, in bed greater than 50% of waking hours []     4    Completely disabled, no self care, confined to bed or chair []     5    Moribund   Past Medical History  Diagnosis Date  . Hypertension   . Constipation, chronic   . GERD (gastroesophageal reflux disease)   . Rib lesion 12/30/2013   Surgical History:  Right shoulder surgery Appendectomy at age 62 Right inguinal hernia repair  Family History:  Patient's family history is significant for 3 children oldest on recently at age 32 had testicular cancer Patient's mother died of dementia at age 97 Father died at age 40 with melanoma A sister died of lung cancer at age 40 A brother died of colon cancer at age 34  History   Social History  . Marital Status: Married    Spouse Name: N/A    Number of Children: 3  .  Years of Education: N/A   Occupational History  . Not on file.   Social History Main Topics  . Smoking status: Former Smoker -- 1.00 packs/day for 4 years    Types: Cigarettes    Quit date: 01/01/1960  . Smokeless tobacco: Never Used  . Alcohol Use: No  . Drug Use: No  .        History  Smoking status  . Former Smoker -- 1.00 packs/day for 4 years  . Types: Cigarettes  . Quit date: 01/01/1960  Smokeless tobacco  . Never Used    History  Alcohol Use No     Allergies  Allergen Reactions  . Dairy Aid [Lactase] Other (See Comments)    ALL Dairy Products/ GI distress    Current Outpatient Prescriptions  Medication Sig Dispense Refill  . aspirin EC 81 MG tablet Take 81 mg by mouth every morning.       . diltiazem (CARTIA XT) 120 MG 24 hr capsule Take 120 mg by mouth 2 (two) times daily.      Marland Kitchen HYDROcodone-acetaminophen (NORCO/VICODIN) 5-325 MG per tablet Take 1 tablet by mouth every 3 (three) hours. Max 8 tabs in a day      . losartan (COZAAR) 100 MG tablet Take 100 mg by  mouth every morning.       Marland Kitchen omeprazole (PRILOSEC) 20 MG capsule Take 20 mg by mouth every morning.       . senna-docusate (SENOKOT-S) 8.6-50 MG per tablet Take 2 tablets by mouth at bedtime.        No current facility-administered medications for this visit.     Review of Systems:     Cardiac Review of Systems: Y or N  Chest Pain [ y left chest wall   ]  Resting SOB [ n  ] Exertional SOB  [ n ]  Orthopnea [ n ]   Pedal Edema [n   ]    Palpitations [ y ] Syncope  [n  ]   Presyncope [n   ]  General Review of Systems: [Y] = yes [  ]=no Constitional: recent weight change Blue.Reese  ];  Wt loss over the last 3 months Roice.Felt   ] anorexia [  ]; fatigue [  ]; nausea [  ]; night sweats [  ]; fever [  ]; or chills [  ];          Dental: poor dentition[ y dentures ]; Last Dentist visit:   Eye : blurred vision [ n ]; diplopia [   ]; vision changes [  ];  Amaurosis fugax[  ]; Resp: cough [n  ];  wheezing[n  ];  hemoptysis[ n ]; shortness of breath[n  ]; paroxysmal nocturnal dyspnea[  ]; dyspnea on exertion[  ]; or orthopnea[  ];  GI:  gallstones[ n ], vomiting[n  ];  dysphagia[  ]; melena[  ];  hematochezia [  ]; heartburn[  ];   Hx of  Colonoscopy[ y ]; GU: kidney stones [  ]; hematuria[ n ];   dysuria [ n ];  nocturia[  ];  history of     obstruction [  ]; urinary frequency [ n ]             Skin: rash, swelling[  ];, hair loss[  ];  peripheral edema[  ];  or itching[  ]; Musculosketetal: myalgias[  ];  joint swelling[ n ];  joint erythema[ n ];  joint pain[  ];  back  pain[y  ];  Heme/Lymph: bruising[n  ];  bleeding[  ];  anemia[  ];  Neuro: TIA[  ];  headaches[  ];  stroke[  ];  vertigo[  ];  seizures[  ];   paresthesias[  ];  difficulty walking[  ];  Psych:depression[  ]; anxiety[  ];  Endocrine: diabetes[  ];  thyroid dysfunction[  ];  Immunizations: Flu up to date [ y ]; Pneumococcal up to date Blue.Reese  ];  Other:  Physical Exam: There were no vitals taken for this visit.  PHYSICAL EXAMINATION:  General  appearance: alert, cooperative, appears stated age and mild distress Neurologic: intact Heart: Irregularly irregular rate and rhythm, S1, S2 normal, no murmur, click, rub or gallop Lungs: clear to auscultation bilaterally and normal percussion bilaterally Abdomen: soft, non-tender; bowel sounds normal; no masses,  no organomegaly Extremities: extremities normal, atraumatic, no cyanosis or edema and Homans sign is negative, no sign of DVT Mass palpable left posterior chest wall approximately 6-7 rib does not appear to involve the movement of the scapula   Diagnostic Studies & Laboratory data:     Recent Radiology Findings:  Ct Chest Wo Contrast  12/31/2013   CLINICAL DATA:  Left-sided rib sarcoma.  Followup.  Possible preop.  EXAM: CT CHEST WITHOUT CONTRAST  TECHNIQUE: Multidetector CT imaging of the chest was performed following the standard protocol without IV contrast.  COMPARISON:  MRI of 12/19/2013.  No prior chest CT.  FINDINGS: Lungs/Pleura: Lingular nodule which measures 1.0 x 1.0 cm on image 41/series 4 and sagittal image 133.  Minimal left major fissure oral thickening/ nodularity on image 32.  Superior segment left lower lobe volume loss adjacent to the rib process detailed below.  No pleural fluid.  Pleural involvement of the chest wall/rib lesion.  Separate area of mild left pleural thickening and nodularity on image 43/series 3 and less so on image 37/series 3.  Heart/Mediastinum: No supraclavicular adenopathy. Aortic and branch vessel atherosclerosis. Tortuous descending thoracic aorta. Mild cardiomegaly, without pericardial effusion.  No mediastinal or definite hilar adenopathy, given limitations of unenhanced CT.  Upper Abdomen: Normal noncontrast appearance of the imaged liver, spleen, stomach, pancreas, gallbladder, biliary tract, adrenal glands. Suspect left-sided renal sinus cysts. Lower pole left renal collecting system calculus.  Bones/Musculoskeletal: Soft tissue mass centered  about the sixth and seventh posterior medial left ribs. Better evaluated on recent MRI. Measures on the order of 4.1 x 5.4 cm on image 28/series 3. Concurrent sixth and seventh rib destruction or involvement.  Sclerotic lesion involving the fifth anterior left rib on image 34 is favored to represent a bone island.  IMPRESSION: 1. Left chest wall/rib based mass, consistent with the clinical history of sarcoma. 2. Isolated lingular nodule, suspicious for pulmonary metastasis. A synchronous primary bronchogenic carcinoma could look similar. Tissue sampling and/or PET should be considered. 3. Foci of pleural nodularity inferior to the chest wall/rib mass. Cannot exclude pleural metastasis. This would also be better evaluated with PET. 4. Left nephrolithiasis.   Electronically Signed   By: Abigail Miyamoto M.D.   On: 12/31/2013 16:05      <HTML><META HTTP-EQUIV="content-type" CONTENT="text/html;charset=utf-8"><P>CLINICAL DATA: Mid back pain for 4 months.<BR> <BR>EXAM:<BR>MRI THORACIC SPINE WITHOUT CONTRAST<BR> <BR>TECHNIQUE:<BR>Multiplanar, multisequence MR imaging of the thoracic spine was<BR>performed. No intravenous contrast was administered.<BR> <BR>COMPARISON: None.<BR> <BR>FINDINGS:<BR>There is a larger lesion emanating from scratch there is a large<BR>lesion centered in the posterior left chest wall, likely the left<BR>sixth rib. The lesion measures 4.3 cm transverse by 4.0 cm AP by 7.6<BR>cm  craniocaudal. The lesion is predominantly isointense to muscle on<BR>T1 weighted imaging with areas of intermediate and increased T2<BR>signal. No other focal bony lesion is identified. Small mucous<BR>retention cyst or polyp in the floor of the left maxillary sinus is<BR>incidentally noted.<BR> <BR>Vertebral body height and alignment are maintained. Degenerative<BR>endplate signal change is seen at L1-2. Bone marrow signal in the<BR>thoracic spine is otherwise unremarkable. The thoracic cord<BR>demonstrates normal signal  throughout. The central spinal canal<BR>neural foramina appear open at all levels. Scattered very shallow<BR>disc bulges are also identified.<BR> <BR>IMPRESSION:<BR>Large destructive rib lesion on the left, likely centered in the<BR>seventh rib. Main differential considerations are chondrosarcoma<BR>versus metastatic disease. 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 In this setting I would not recommend resection of the rib as a palliative procedure. I recommended the patient proceed with radiation therapy consult for palliative pain control in the rib and also to return to see the medical oncology Dr. Kallie Edward . The patient prefers to have radiation therapy in [place] and we will make an appointment for him to be seen as soon as possible. Within the tissue block returns from [facility] clinic Dr. Donato Heinz of pathology will send the specimen to [facility] for complete genetic testing which may help determine therapy for stage IV carcinoma the lung.   Grace Isaac MD      [address] [place] [address] Office [phone]   Beeper [phone]  01/21/2014 3:04 PM

## 2014-01-22 ENCOUNTER — Ambulatory Visit: Payer: Self-pay | Admitting: Hematology and Oncology

## 2014-01-30 LAB — COMPREHENSIVE METABOLIC PANEL
AST: 6 U/L — AB (ref 15–37)
Albumin: 3.6 g/dL (ref 3.4–5.0)
Alkaline Phosphatase: 96 U/L
Anion Gap: 6 — ABNORMAL LOW (ref 7–16)
BUN: 13 mg/dL (ref 7–18)
Bilirubin,Total: 0.4 mg/dL (ref 0.2–1.0)
CALCIUM: 8.7 mg/dL (ref 8.5–10.1)
Chloride: 99 mmol/L (ref 98–107)
Co2: 32 mmol/L (ref 21–32)
Creatinine: 1 mg/dL (ref 0.60–1.30)
EGFR (Non-African Amer.): >60
Glucose: 147 mg/dL — ABNORMAL HIGH (ref 65–99)
Osmolality: 277 (ref 275–301)
Potassium: 4.5 mmol/L (ref 3.5–5.1)
SGPT (ALT): 17 U/L (ref 12–78)
SODIUM: 137 mmol/L (ref 136–145)
Total Protein: 6.9 g/dL (ref 6.4–8.2)

## 2014-01-30 LAB — CBC CANCER CENTER
BASOS ABS: 0.1 x10 3/mm (ref 0.0–0.1)
Basophil %: 0.6 %
EOS ABS: 0.3 x10 3/mm (ref 0.0–0.7)
Eosinophil %: 1.6 %
HCT: 46.9 % (ref 40.0–52.0)
HGB: 15.6 g/dL (ref 13.0–18.0)
Lymphocyte #: 1.4 x10 3/mm (ref 1.0–3.6)
Lymphocyte %: 8 %
MCH: 29 pg (ref 26.0–34.0)
MCHC: 33.2 g/dL (ref 32.0–36.0)
MCV: 88 fL (ref 80–100)
MONOS PCT: 8.6 %
Monocyte #: 1.5 x10 3/mm — ABNORMAL HIGH (ref 0.2–1.0)
NEUTROS PCT: 81.2 %
Neutrophil #: 14.1 x10 3/mm — ABNORMAL HIGH (ref 1.4–6.5)
PLATELETS: 360 x10 3/mm (ref 150–440)
RBC: 5.35 10*6/uL (ref 4.40–5.90)
RDW: 13 % (ref 11.5–14.5)
WBC: 17.3 x10 3/mm — AB (ref 3.8–10.6)

## 2014-02-03 LAB — BASIC METABOLIC PANEL
ANION GAP: 9 (ref 7–16)
BUN: 38 mg/dL — ABNORMAL HIGH (ref 7–18)
CHLORIDE: 95 mmol/L — AB (ref 98–107)
Calcium, Total: 8.5 mg/dL (ref 8.5–10.1)
Co2: 29 mmol/L (ref 21–32)
Creatinine: 1.33 mg/dL — ABNORMAL HIGH (ref 0.60–1.30)
EGFR (African American): 59 — ABNORMAL LOW
EGFR (Non-African Amer.): 51 — ABNORMAL LOW
Glucose: 119 mg/dL — ABNORMAL HIGH (ref 65–99)
OSMOLALITY: 277 (ref 275–301)
POTASSIUM: 4.1 mmol/L (ref 3.5–5.1)
Sodium: 133 mmol/L — ABNORMAL LOW (ref 136–145)

## 2014-02-03 LAB — CBC CANCER CENTER
BASOS ABS: 0 x10 3/mm (ref 0.0–0.1)
Basophil %: 0.1 %
Eosinophil #: 0.1 x10 3/mm (ref 0.0–0.7)
Eosinophil %: 0.4 %
HCT: 44.2 % (ref 40.0–52.0)
HGB: 14.9 g/dL (ref 13.0–18.0)
Lymphocyte #: 0.6 x10 3/mm — ABNORMAL LOW (ref 1.0–3.6)
Lymphocyte %: 3.6 %
MCH: 28.9 pg (ref 26.0–34.0)
MCHC: 33.7 g/dL (ref 32.0–36.0)
MCV: 86 fL (ref 80–100)
MONO ABS: 0.8 x10 3/mm (ref 0.2–1.0)
MONOS PCT: 4.5 %
Neutrophil #: 15.6 x10 3/mm — ABNORMAL HIGH (ref 1.4–6.5)
Neutrophil %: 91.4 %
Platelet: 267 x10 3/mm (ref 150–440)
RBC: 5.17 10*6/uL (ref 4.40–5.90)
RDW: 13.2 % (ref 11.5–14.5)
WBC: 17.1 x10 3/mm — AB (ref 3.8–10.6)

## 2014-02-05 LAB — CBC CANCER CENTER
Basophil #: 0 x10 3/mm (ref 0.0–0.1)
Basophil %: 0.3 %
Eosinophil #: 0.1 x10 3/mm (ref 0.0–0.7)
Eosinophil %: 1 %
HCT: 46.4 % (ref 40.0–52.0)
HGB: 15.4 g/dL (ref 13.0–18.0)
LYMPHS ABS: 0.8 x10 3/mm — AB (ref 1.0–3.6)
Lymphocyte %: 7.1 %
MCH: 28.8 pg (ref 26.0–34.0)
MCHC: 33.3 g/dL (ref 32.0–36.0)
MCV: 87 fL (ref 80–100)
MONO ABS: 0.9 x10 3/mm (ref 0.2–1.0)
Monocyte %: 8.4 %
NEUTROS PCT: 83.2 %
Neutrophil #: 9.3 x10 3/mm — ABNORMAL HIGH (ref 1.4–6.5)
Platelet: 320 x10 3/mm (ref 150–440)
RBC: 5.35 10*6/uL (ref 4.40–5.90)
RDW: 13.1 % (ref 11.5–14.5)
WBC: 11.2 x10 3/mm — AB (ref 3.8–10.6)

## 2014-02-07 LAB — CBC CANCER CENTER
BASOS ABS: 0.1 x10 3/mm (ref 0.0–0.1)
Basophil %: 0.4 %
EOS ABS: 0.1 x10 3/mm (ref 0.0–0.7)
Eosinophil %: 1 %
HCT: 45.3 % (ref 40.0–52.0)
HGB: 15.1 g/dL (ref 13.0–18.0)
LYMPHS ABS: 1.1 x10 3/mm (ref 1.0–3.6)
Lymphocyte %: 8.8 %
MCH: 29 pg (ref 26.0–34.0)
MCHC: 33.3 g/dL (ref 32.0–36.0)
MCV: 87 fL (ref 80–100)
Monocyte #: 1.2 x10 3/mm — ABNORMAL HIGH (ref 0.2–1.0)
Monocyte %: 9.3 %
NEUTROS ABS: 10.1 x10 3/mm — AB (ref 1.4–6.5)
NEUTROS PCT: 80.5 %
Platelet: 351 x10 3/mm (ref 150–440)
RBC: 5.21 10*6/uL (ref 4.40–5.90)
RDW: 13.3 % (ref 11.5–14.5)
WBC: 12.6 x10 3/mm — ABNORMAL HIGH (ref 3.8–10.6)

## 2014-02-12 LAB — CBC CANCER CENTER
Basophil #: 0 x10 3/mm (ref 0.0–0.1)
Basophil %: 0.5 %
EOS ABS: 0.1 x10 3/mm (ref 0.0–0.7)
Eosinophil %: 0.8 %
HCT: 46.5 % (ref 40.0–52.0)
HGB: 15 g/dL (ref 13.0–18.0)
LYMPHS PCT: 11 %
Lymphocyte #: 0.8 x10 3/mm — ABNORMAL LOW (ref 1.0–3.6)
MCH: 28.6 pg (ref 26.0–34.0)
MCHC: 32.2 g/dL (ref 32.0–36.0)
MCV: 89 fL (ref 80–100)
MONO ABS: 1.1 x10 3/mm — AB (ref 0.2–1.0)
Monocyte %: 14.9 %
NEUTROS ABS: 5.6 x10 3/mm (ref 1.4–6.5)
NEUTROS PCT: 72.8 %
Platelet: 328 x10 3/mm (ref 150–440)
RBC: 5.23 10*6/uL (ref 4.40–5.90)
RDW: 13.7 % (ref 11.5–14.5)
WBC: 7.6 x10 3/mm (ref 3.8–10.6)

## 2014-02-14 LAB — CBC CANCER CENTER
Basophil #: 0.1 x10 3/mm (ref 0.0–0.1)
Basophil %: 0.8 %
EOS ABS: 0.1 x10 3/mm (ref 0.0–0.7)
Eosinophil %: 1 %
HCT: 44.1 % (ref 40.0–52.0)
HGB: 14.4 g/dL (ref 13.0–18.0)
LYMPHS ABS: 0.9 x10 3/mm — AB (ref 1.0–3.6)
LYMPHS PCT: 10.8 %
MCH: 28.9 pg (ref 26.0–34.0)
MCHC: 32.6 g/dL (ref 32.0–36.0)
MCV: 89 fL (ref 80–100)
Monocyte #: 1.2 x10 3/mm — ABNORMAL HIGH (ref 0.2–1.0)
Monocyte %: 14.4 %
NEUTROS ABS: 5.9 x10 3/mm (ref 1.4–6.5)
Neutrophil %: 73 %
PLATELETS: 319 x10 3/mm (ref 150–440)
RBC: 4.96 10*6/uL (ref 4.40–5.90)
RDW: 13.6 % (ref 11.5–14.5)
WBC: 8.1 x10 3/mm (ref 3.8–10.6)

## 2014-02-14 LAB — BASIC METABOLIC PANEL
ANION GAP: 6 — AB (ref 7–16)
BUN: 10 mg/dL (ref 7–18)
CHLORIDE: 101 mmol/L (ref 98–107)
CREATININE: 1.15 mg/dL (ref 0.60–1.30)
Calcium, Total: 8.2 mg/dL — ABNORMAL LOW (ref 8.5–10.1)
Co2: 30 mmol/L (ref 21–32)
EGFR (Non-African Amer.): >60
Glucose: 112 mg/dL — ABNORMAL HIGH (ref 65–99)
Osmolality: 274 (ref 275–301)
POTASSIUM: 3.8 mmol/L (ref 3.5–5.1)
Sodium: 137 mmol/L (ref 136–145)

## 2014-02-14 LAB — HEPATIC FUNCTION PANEL A (ARMC)
ALK PHOS: 91 U/L
AST: 7 U/L — AB (ref 15–37)
Albumin: 3.1 g/dL — ABNORMAL LOW (ref 3.4–5.0)
BILIRUBIN DIRECT: 0.1 mg/dL (ref 0.00–0.20)
BILIRUBIN TOTAL: 0.4 mg/dL (ref 0.2–1.0)
SGPT (ALT): 24 U/L
Total Protein: 6.1 g/dL — ABNORMAL LOW (ref 6.4–8.2)

## 2014-02-21 LAB — URINALYSIS, COMPLETE
Bacteria: NONE SEEN
Bilirubin,UR: NEGATIVE
Blood: NEGATIVE
Glucose,UR: 50 mg/dL (ref 0–75)
KETONE: NEGATIVE
Leukocyte Esterase: NEGATIVE
NITRITE: NEGATIVE
PH: 5 (ref 4.5–8.0)
Protein: NEGATIVE
Specific Gravity: 1.015 (ref 1.003–1.030)
WBC UR: 1 /HPF (ref 0–5)

## 2014-02-21 LAB — CBC CANCER CENTER
BASOS ABS: 0.1 x10 3/mm (ref 0.0–0.1)
Basophil %: 0.7 %
Eosinophil #: 0 x10 3/mm (ref 0.0–0.7)
Eosinophil %: 0.6 %
HCT: 45.6 % (ref 40.0–52.0)
HGB: 15 g/dL (ref 13.0–18.0)
LYMPHS ABS: 0.7 x10 3/mm — AB (ref 1.0–3.6)
LYMPHS PCT: 9.2 %
MCH: 29.2 pg (ref 26.0–34.0)
MCHC: 32.9 g/dL (ref 32.0–36.0)
MCV: 89 fL (ref 80–100)
MONOS PCT: 8.8 %
Monocyte #: 0.6 x10 3/mm (ref 0.2–1.0)
Neutrophil #: 5.7 x10 3/mm (ref 1.4–6.5)
Neutrophil %: 80.7 %
Platelet: 286 x10 3/mm (ref 150–440)
RBC: 5.14 10*6/uL (ref 4.40–5.90)
RDW: 13.7 % (ref 11.5–14.5)
WBC: 7.1 x10 3/mm (ref 3.8–10.6)

## 2014-02-21 LAB — COMPREHENSIVE METABOLIC PANEL
ANION GAP: 6 — AB (ref 7–16)
Albumin: 3.2 g/dL — ABNORMAL LOW (ref 3.4–5.0)
Alkaline Phosphatase: 83 U/L
BUN: 13 mg/dL (ref 7–18)
Bilirubin,Total: 0.6 mg/dL (ref 0.2–1.0)
Calcium, Total: 8.2 mg/dL — ABNORMAL LOW (ref 8.5–10.1)
Chloride: 101 mmol/L (ref 98–107)
Co2: 29 mmol/L (ref 21–32)
Creatinine: 1.15 mg/dL (ref 0.60–1.30)
EGFR (African American): >60
GLUCOSE: 177 mg/dL — AB (ref 65–99)
OSMOLALITY: 276 (ref 275–301)
Potassium: 4.2 mmol/L (ref 3.5–5.1)
SGOT(AST): 9 U/L — ABNORMAL LOW (ref 15–37)
SGPT (ALT): 31 U/L
Sodium: 136 mmol/L (ref 136–145)
Total Protein: 6.1 g/dL — ABNORMAL LOW (ref 6.4–8.2)

## 2014-02-22 ENCOUNTER — Ambulatory Visit: Payer: Self-pay | Admitting: Hematology and Oncology

## 2014-02-26 LAB — CBC CANCER CENTER
BASOS ABS: 0 x10 3/mm (ref 0.0–0.1)
BASOS PCT: 0.5 %
Eosinophil #: 0 x10 3/mm (ref 0.0–0.7)
Eosinophil %: 0.7 %
HCT: 41.5 % (ref 40.0–52.0)
HGB: 13.9 g/dL (ref 13.0–18.0)
Lymphocyte #: 0.5 x10 3/mm — ABNORMAL LOW (ref 1.0–3.6)
Lymphocyte %: 8 %
MCH: 29.8 pg (ref 26.0–34.0)
MCHC: 33.4 g/dL (ref 32.0–36.0)
MCV: 89 fL (ref 80–100)
MONOS PCT: 6.2 %
Monocyte #: 0.4 x10 3/mm (ref 0.2–1.0)
NEUTROS ABS: 5.2 x10 3/mm (ref 1.4–6.5)
Neutrophil %: 84.6 %
Platelet: 191 x10 3/mm (ref 150–440)
RBC: 4.65 10*6/uL (ref 4.40–5.90)
RDW: 13.4 % (ref 11.5–14.5)
WBC: 6.1 x10 3/mm (ref 3.8–10.6)

## 2014-02-28 LAB — CBC CANCER CENTER
Basophil #: 0 x10 3/mm (ref 0.0–0.1)
Basophil %: 0.3 %
EOS PCT: 1.3 %
Eosinophil #: 0.1 x10 3/mm (ref 0.0–0.7)
HCT: 42.2 % (ref 40.0–52.0)
HGB: 14.2 g/dL (ref 13.0–18.0)
Lymphocyte #: 0.5 x10 3/mm — ABNORMAL LOW (ref 1.0–3.6)
Lymphocyte %: 9.1 %
MCH: 29.8 pg (ref 26.0–34.0)
MCHC: 33.7 g/dL (ref 32.0–36.0)
MCV: 89 fL (ref 80–100)
Monocyte #: 0.5 x10 3/mm (ref 0.2–1.0)
Monocyte %: 9.4 %
NEUTROS PCT: 79.9 %
Neutrophil #: 3.9 x10 3/mm (ref 1.4–6.5)
Platelet: 189 x10 3/mm (ref 150–440)
RBC: 4.77 10*6/uL (ref 4.40–5.90)
RDW: 13.6 % (ref 11.5–14.5)
WBC: 4.9 x10 3/mm (ref 3.8–10.6)

## 2014-02-28 LAB — COMPREHENSIVE METABOLIC PANEL
ALK PHOS: 86 U/L
AST: 7 U/L — AB (ref 15–37)
Albumin: 2.9 g/dL — ABNORMAL LOW (ref 3.4–5.0)
Anion Gap: 2 — ABNORMAL LOW (ref 7–16)
BILIRUBIN TOTAL: 0.5 mg/dL (ref 0.2–1.0)
BUN: 10 mg/dL (ref 7–18)
CREATININE: 1.06 mg/dL (ref 0.60–1.30)
Calcium, Total: 8.1 mg/dL — ABNORMAL LOW (ref 8.5–10.1)
Chloride: 100 mmol/L (ref 98–107)
Co2: 33 mmol/L — ABNORMAL HIGH (ref 21–32)
EGFR (African American): >60
EGFR (Non-African Amer.): >60
Glucose: 111 mg/dL — ABNORMAL HIGH (ref 65–99)
Osmolality: 270 (ref 275–301)
POTASSIUM: 4.5 mmol/L (ref 3.5–5.1)
SGPT (ALT): 29 U/L
Sodium: 135 mmol/L — ABNORMAL LOW (ref 136–145)
Total Protein: 5.6 g/dL — ABNORMAL LOW (ref 6.4–8.2)

## 2014-03-05 LAB — CBC CANCER CENTER
BASOS ABS: 0 x10 3/mm (ref 0.0–0.1)
BASOS PCT: 0.5 %
Eosinophil #: 0 x10 3/mm (ref 0.0–0.7)
Eosinophil %: 0.6 %
HCT: 42.7 % (ref 40.0–52.0)
HGB: 13.9 g/dL (ref 13.0–18.0)
Lymphocyte #: 0.5 x10 3/mm — ABNORMAL LOW (ref 1.0–3.6)
Lymphocyte %: 10.7 %
MCH: 29.3 pg (ref 26.0–34.0)
MCHC: 32.6 g/dL (ref 32.0–36.0)
MCV: 90 fL (ref 80–100)
Monocyte #: 0.4 x10 3/mm (ref 0.2–1.0)
Monocyte %: 8 %
NEUTROS PCT: 80.2 %
Neutrophil #: 4 x10 3/mm (ref 1.4–6.5)
PLATELETS: 233 x10 3/mm (ref 150–440)
RBC: 4.74 10*6/uL (ref 4.40–5.90)
RDW: 13.9 % (ref 11.5–14.5)
WBC: 4.9 x10 3/mm (ref 3.8–10.6)

## 2014-03-07 LAB — CBC CANCER CENTER
BASOS ABS: 0.1 x10 3/mm (ref 0.0–0.1)
Basophil %: 1.1 %
EOS ABS: 0 x10 3/mm (ref 0.0–0.7)
Eosinophil %: 0.6 %
HCT: 42.2 % (ref 40.0–52.0)
HGB: 13.7 g/dL (ref 13.0–18.0)
LYMPHS ABS: 0.4 x10 3/mm — AB (ref 1.0–3.6)
Lymphocyte %: 7.4 %
MCH: 29.8 pg (ref 26.0–34.0)
MCHC: 32.5 g/dL (ref 32.0–36.0)
MCV: 92 fL (ref 80–100)
Monocyte #: 0.6 x10 3/mm (ref 0.2–1.0)
Monocyte %: 10.2 %
NEUTROS ABS: 4.8 x10 3/mm (ref 1.4–6.5)
Neutrophil %: 80.7 %
Platelet: 259 x10 3/mm (ref 150–440)
RBC: 4.6 10*6/uL (ref 4.40–5.90)
RDW: 14.2 % (ref 11.5–14.5)
WBC: 5.9 x10 3/mm (ref 3.8–10.6)

## 2014-03-07 LAB — COMPREHENSIVE METABOLIC PANEL
ALT: 19 U/L
ANION GAP: 3 — AB (ref 7–16)
AST: 6 U/L — AB (ref 15–37)
Albumin: 3.3 g/dL — ABNORMAL LOW (ref 3.4–5.0)
Alkaline Phosphatase: 91 U/L
BILIRUBIN TOTAL: 0.4 mg/dL (ref 0.2–1.0)
BUN: 9 mg/dL (ref 7–18)
CALCIUM: 8.4 mg/dL — AB (ref 8.5–10.1)
CHLORIDE: 103 mmol/L (ref 98–107)
Co2: 31 mmol/L (ref 21–32)
Creatinine: 1.07 mg/dL (ref 0.60–1.30)
EGFR (African American): >60
EGFR (Non-African Amer.): >60
Glucose: 111 mg/dL — ABNORMAL HIGH (ref 65–99)
OSMOLALITY: 273 (ref 275–301)
Potassium: 4.8 mmol/L (ref 3.5–5.1)
Sodium: 137 mmol/L (ref 136–145)
TOTAL PROTEIN: 6.1 g/dL — AB (ref 6.4–8.2)

## 2014-03-12 LAB — CBC CANCER CENTER
BASOS ABS: 0 x10 3/mm (ref 0.0–0.1)
BASOS PCT: 0.5 %
EOS ABS: 0 x10 3/mm (ref 0.0–0.7)
EOS PCT: 0.6 %
HCT: 41 % (ref 40.0–52.0)
HGB: 13.8 g/dL (ref 13.0–18.0)
Lymphocyte #: 0.4 x10 3/mm — ABNORMAL LOW (ref 1.0–3.6)
Lymphocyte %: 8.1 %
MCH: 30.2 pg (ref 26.0–34.0)
MCHC: 33.6 g/dL (ref 32.0–36.0)
MCV: 90 fL (ref 80–100)
MONOS PCT: 9 %
Monocyte #: 0.5 x10 3/mm (ref 0.2–1.0)
Neutrophil #: 4.2 x10 3/mm (ref 1.4–6.5)
Neutrophil %: 81.8 %
Platelet: 235 x10 3/mm (ref 150–440)
RBC: 4.55 10*6/uL (ref 4.40–5.90)
RDW: 14.9 % — AB (ref 11.5–14.5)
WBC: 5.2 x10 3/mm (ref 3.8–10.6)

## 2014-03-14 LAB — CBC CANCER CENTER
Basophil #: 0 x10 3/mm (ref 0.0–0.1)
Basophil %: 0.5 %
EOS ABS: 0 x10 3/mm (ref 0.0–0.7)
Eosinophil %: 0.6 %
HCT: 41.5 % (ref 40.0–52.0)
HGB: 14 g/dL (ref 13.0–18.0)
Lymphocyte #: 0.4 x10 3/mm — ABNORMAL LOW (ref 1.0–3.6)
Lymphocyte %: 6.9 %
MCH: 30.5 pg (ref 26.0–34.0)
MCHC: 33.7 g/dL (ref 32.0–36.0)
MCV: 91 fL (ref 80–100)
Monocyte #: 0.5 x10 3/mm (ref 0.2–1.0)
Monocyte %: 9.5 %
NEUTROS PCT: 82.5 %
Neutrophil #: 4.6 x10 3/mm (ref 1.4–6.5)
Platelet: 246 x10 3/mm (ref 150–440)
RBC: 4.59 10*6/uL (ref 4.40–5.90)
RDW: 15.1 % — ABNORMAL HIGH (ref 11.5–14.5)
WBC: 5.6 x10 3/mm (ref 3.8–10.6)

## 2014-03-14 LAB — COMPREHENSIVE METABOLIC PANEL
ANION GAP: 6 — AB (ref 7–16)
AST: 10 U/L — AB (ref 15–37)
Albumin: 3.3 g/dL — ABNORMAL LOW (ref 3.4–5.0)
Alkaline Phosphatase: 89 U/L
BUN: 10 mg/dL (ref 7–18)
Bilirubin,Total: 0.6 mg/dL (ref 0.2–1.0)
CREATININE: 0.96 mg/dL (ref 0.60–1.30)
Calcium, Total: 8.1 mg/dL — ABNORMAL LOW (ref 8.5–10.1)
Chloride: 101 mmol/L (ref 98–107)
Co2: 31 mmol/L (ref 21–32)
EGFR (African American): >60
Glucose: 111 mg/dL — ABNORMAL HIGH (ref 65–99)
OSMOLALITY: 275 (ref 275–301)
Potassium: 4.2 mmol/L (ref 3.5–5.1)
SGPT (ALT): 29 U/L
Sodium: 138 mmol/L (ref 136–145)
Total Protein: 5.9 g/dL — ABNORMAL LOW (ref 6.4–8.2)

## 2014-03-21 ENCOUNTER — Encounter (HOSPITAL_COMMUNITY): Payer: Self-pay

## 2014-03-25 ENCOUNTER — Ambulatory Visit: Payer: Self-pay | Admitting: Hematology and Oncology

## 2014-04-01 ENCOUNTER — Ambulatory Visit: Payer: Self-pay | Admitting: Family Medicine

## 2014-04-03 LAB — TSH: Thyroid Stimulating Horm: 3.03 u[IU]/mL

## 2014-04-03 LAB — COMPREHENSIVE METABOLIC PANEL
ANION GAP: 6 — AB (ref 7–16)
Albumin: 3.2 g/dL — ABNORMAL LOW (ref 3.4–5.0)
Alkaline Phosphatase: 104 U/L
BUN: 7 mg/dL (ref 7–18)
Bilirubin,Total: 0.5 mg/dL (ref 0.2–1.0)
Calcium, Total: 8.3 mg/dL — ABNORMAL LOW (ref 8.5–10.1)
Chloride: 101 mmol/L (ref 98–107)
Co2: 30 mmol/L (ref 21–32)
Creatinine: 1.01 mg/dL (ref 0.60–1.30)
GLUCOSE: 107 mg/dL — AB (ref 65–99)
OSMOLALITY: 272 (ref 275–301)
Potassium: 4.1 mmol/L (ref 3.5–5.1)
SGOT(AST): 8 U/L — ABNORMAL LOW (ref 15–37)
SGPT (ALT): 18 U/L
SODIUM: 137 mmol/L (ref 136–145)
Total Protein: 6 g/dL — ABNORMAL LOW (ref 6.4–8.2)

## 2014-04-03 LAB — CBC CANCER CENTER
BASOS PCT: 0.5 %
Basophil #: 0 x10 3/mm (ref 0.0–0.1)
EOS ABS: 0.1 x10 3/mm (ref 0.0–0.7)
Eosinophil %: 0.8 %
HCT: 40.8 % (ref 40.0–52.0)
HGB: 13.6 g/dL (ref 13.0–18.0)
LYMPHS PCT: 4.8 %
Lymphocyte #: 0.3 x10 3/mm — ABNORMAL LOW (ref 1.0–3.6)
MCH: 30.7 pg (ref 26.0–34.0)
MCHC: 33.4 g/dL (ref 32.0–36.0)
MCV: 92 fL (ref 80–100)
MONO ABS: 0.8 x10 3/mm (ref 0.2–1.0)
MONOS PCT: 11.7 %
Neutrophil #: 5.8 x10 3/mm (ref 1.4–6.5)
Neutrophil %: 82.2 %
Platelet: 270 x10 3/mm (ref 150–440)
RBC: 4.44 10*6/uL (ref 4.40–5.90)
RDW: 19.1 % — AB (ref 11.5–14.5)
WBC: 7.1 x10 3/mm (ref 3.8–10.6)

## 2014-04-08 ENCOUNTER — Observation Stay: Payer: Self-pay | Admitting: Internal Medicine

## 2014-04-08 LAB — BASIC METABOLIC PANEL
Anion Gap: 10 (ref 7–16)
BUN: 12 mg/dL (ref 7–18)
CO2: 25 mmol/L (ref 21–32)
CREATININE: 0.94 mg/dL (ref 0.60–1.30)
Calcium, Total: 8.3 mg/dL — ABNORMAL LOW (ref 8.5–10.1)
Chloride: 101 mmol/L (ref 98–107)
EGFR (African American): >60
EGFR (Non-African Amer.): >60
Glucose: 110 mg/dL — ABNORMAL HIGH (ref 65–99)
OSMOLALITY: 272 (ref 275–301)
POTASSIUM: 3.8 mmol/L (ref 3.5–5.1)
SODIUM: 136 mmol/L (ref 136–145)

## 2014-04-08 LAB — CBC
HCT: 39.9 % — AB (ref 40.0–52.0)
HGB: 13.3 g/dL (ref 13.0–18.0)
MCH: 30.8 pg (ref 26.0–34.0)
MCHC: 33.3 g/dL (ref 32.0–36.0)
MCV: 92 fL (ref 80–100)
PLATELETS: 346 10*3/uL (ref 150–440)
RBC: 4.32 10*6/uL — ABNORMAL LOW (ref 4.40–5.90)
RDW: 18.9 % — AB (ref 11.5–14.5)
WBC: 10.7 10*3/uL — AB (ref 3.8–10.6)

## 2014-04-08 LAB — PROTIME-INR
INR: 1.1
Prothrombin Time: 14.1 secs (ref 11.5–14.7)

## 2014-04-08 LAB — PRO B NATRIURETIC PEPTIDE: B-TYPE NATIURETIC PEPTID: 570 pg/mL — AB (ref 0–450)

## 2014-04-08 LAB — TROPONIN I: Troponin-I: <0.02 ng/mL

## 2014-04-08 LAB — MAGNESIUM: MAGNESIUM: 1.9 mg/dL

## 2014-04-09 LAB — BASIC METABOLIC PANEL
Anion Gap: 7 (ref 7–16)
BUN: 12 mg/dL (ref 7–18)
CALCIUM: 8.4 mg/dL — AB (ref 8.5–10.1)
CHLORIDE: 102 mmol/L (ref 98–107)
CREATININE: 0.79 mg/dL (ref 0.60–1.30)
Co2: 28 mmol/L (ref 21–32)
EGFR (African American): >60
GLUCOSE: 129 mg/dL — AB (ref 65–99)
Osmolality: 275 (ref 275–301)
Potassium: 4.8 mmol/L (ref 3.5–5.1)
Sodium: 137 mmol/L (ref 136–145)

## 2014-04-09 LAB — CBC WITH DIFFERENTIAL/PLATELET
BASOS ABS: 0 10*3/uL (ref 0.0–0.1)
Basophil %: 0.2 %
Eosinophil #: 0 10*3/uL (ref 0.0–0.7)
Eosinophil %: 0 %
HCT: 34.9 % — ABNORMAL LOW (ref 40.0–52.0)
HGB: 11.9 g/dL — ABNORMAL LOW (ref 13.0–18.0)
LYMPHS PCT: 3.8 %
Lymphocyte #: 0.3 10*3/uL — ABNORMAL LOW (ref 1.0–3.6)
MCH: 31.5 pg (ref 26.0–34.0)
MCHC: 34.1 g/dL (ref 32.0–36.0)
MCV: 92 fL (ref 80–100)
MONO ABS: 0.5 x10 3/mm (ref 0.2–1.0)
Monocyte %: 5.8 %
NEUTROS PCT: 90.2 %
Neutrophil #: 7.3 10*3/uL — ABNORMAL HIGH (ref 1.4–6.5)
Platelet: 302 10*3/uL (ref 150–440)
RBC: 3.78 10*6/uL — ABNORMAL LOW (ref 4.40–5.90)
RDW: 18.7 % — ABNORMAL HIGH (ref 11.5–14.5)
WBC: 8.1 10*3/uL (ref 3.8–10.6)

## 2014-04-17 LAB — COMPREHENSIVE METABOLIC PANEL
ALK PHOS: 82 U/L
ALT: 40 U/L
Albumin: 2.8 g/dL — ABNORMAL LOW (ref 3.4–5.0)
Anion Gap: 12 (ref 7–16)
BILIRUBIN TOTAL: 0.3 mg/dL (ref 0.2–1.0)
BUN: 17 mg/dL (ref 7–18)
CHLORIDE: 96 mmol/L — AB (ref 98–107)
Calcium, Total: 8.3 mg/dL — ABNORMAL LOW (ref 8.5–10.1)
Co2: 25 mmol/L (ref 21–32)
Creatinine: 0.95 mg/dL (ref 0.60–1.30)
EGFR (African American): >60
Glucose: 142 mg/dL — ABNORMAL HIGH (ref 65–99)
Osmolality: 270 (ref 275–301)
Potassium: 3.8 mmol/L (ref 3.5–5.1)
SGOT(AST): 8 U/L — ABNORMAL LOW (ref 15–37)
Sodium: 133 mmol/L — ABNORMAL LOW (ref 136–145)
Total Protein: 5.7 g/dL — ABNORMAL LOW (ref 6.4–8.2)

## 2014-04-17 LAB — CBC CANCER CENTER
BANDS NEUTROPHIL: 2 %
HCT: 42.7 % (ref 40.0–52.0)
HGB: 14.2 g/dL (ref 13.0–18.0)
Lymphocytes: 2 %
MCH: 30.8 pg (ref 26.0–34.0)
MCHC: 33.3 g/dL (ref 32.0–36.0)
MCV: 93 fL (ref 80–100)
METAMYELOCYTE: 1 %
Monocytes: 10 %
Myelocyte: 3 %
Platelet: 382 x10 3/mm (ref 150–440)
RBC: 4.62 10*6/uL (ref 4.40–5.90)
RDW: 18.8 % — AB (ref 11.5–14.5)
Segmented Neutrophils: 82 %
WBC: 21.8 x10 3/mm — AB (ref 3.8–10.6)

## 2014-04-24 ENCOUNTER — Ambulatory Visit: Payer: Self-pay | Admitting: Hematology and Oncology

## 2014-05-01 ENCOUNTER — Inpatient Hospital Stay: Payer: Self-pay | Admitting: Internal Medicine

## 2014-05-01 LAB — COMPREHENSIVE METABOLIC PANEL
ALT: 39 U/L
Albumin: 2.8 g/dL — ABNORMAL LOW (ref 3.4–5.0)
Alkaline Phosphatase: 69 U/L
Anion Gap: 6 — ABNORMAL LOW (ref 7–16)
BUN: 13 mg/dL (ref 7–18)
Bilirubin,Total: 0.9 mg/dL (ref 0.2–1.0)
Calcium, Total: 6.5 mg/dL — CL (ref 8.5–10.1)
Chloride: 100 mmol/L (ref 98–107)
Co2: 30 mmol/L (ref 21–32)
Creatinine: 0.7 mg/dL (ref 0.60–1.30)
EGFR (African American): >60
Glucose: 113 mg/dL — ABNORMAL HIGH (ref 65–99)
Osmolality: 273 (ref 275–301)
Potassium: 4.3 mmol/L (ref 3.5–5.1)
SGOT(AST): 12 U/L — ABNORMAL LOW (ref 15–37)
Sodium: 136 mmol/L (ref 136–145)
TOTAL PROTEIN: 5.6 g/dL — AB (ref 6.4–8.2)

## 2014-05-01 LAB — URINALYSIS, COMPLETE
BACTERIA: NONE SEEN
Bilirubin,UR: NEGATIVE
Blood: NEGATIVE
Glucose,UR: NEGATIVE mg/dL (ref 0–75)
Ketone: NEGATIVE
LEUKOCYTE ESTERASE: NEGATIVE
Nitrite: NEGATIVE
Ph: 7 (ref 4.5–8.0)
Protein: NEGATIVE
SPECIFIC GRAVITY: 1.014 (ref 1.003–1.030)
SQUAMOUS EPITHELIAL: NONE SEEN
WBC UR: <1 /HPF (ref 0–5)

## 2014-05-01 LAB — CBC
HCT: 43.4 % (ref 40.0–52.0)
HGB: 14 g/dL (ref 13.0–18.0)
MCH: 30.6 pg (ref 26.0–34.0)
MCHC: 32.2 g/dL (ref 32.0–36.0)
MCV: 95 fL (ref 80–100)
Platelet: 289 10*3/uL (ref 150–440)
RBC: 4.57 10*6/uL (ref 4.40–5.90)
RDW: 18.1 % — ABNORMAL HIGH (ref 11.5–14.5)
WBC: 15.2 10*3/uL — AB (ref 3.8–10.6)

## 2014-05-01 LAB — TROPONIN I: Troponin-I: <0.02 ng/mL

## 2014-05-06 LAB — CULTURE, BLOOD (SINGLE)

## 2014-05-09 LAB — COMPREHENSIVE METABOLIC PANEL
AST: 8 U/L — AB (ref 15–37)
Albumin: 3 g/dL — ABNORMAL LOW (ref 3.4–5.0)
Alkaline Phosphatase: 61 U/L
Anion Gap: 5 — ABNORMAL LOW (ref 7–16)
BUN: 18 mg/dL (ref 7–18)
Bilirubin,Total: 1 mg/dL (ref 0.2–1.0)
CALCIUM: 7.7 mg/dL — AB (ref 8.5–10.1)
CHLORIDE: 99 mmol/L (ref 98–107)
Co2: 31 mmol/L (ref 21–32)
Creatinine: 0.93 mg/dL (ref 0.60–1.30)
EGFR (Non-African Amer.): >60
Glucose: 153 mg/dL — ABNORMAL HIGH (ref 65–99)
OSMOLALITY: 275 (ref 275–301)
Potassium: 3.9 mmol/L (ref 3.5–5.1)
SGPT (ALT): 38 U/L
Sodium: 135 mmol/L — ABNORMAL LOW (ref 136–145)
TOTAL PROTEIN: 5.4 g/dL — AB (ref 6.4–8.2)

## 2014-05-09 LAB — CBC CANCER CENTER
BASOS ABS: 0 x10 3/mm (ref 0.0–0.1)
Basophil %: 0.2 %
EOS ABS: 0 x10 3/mm (ref 0.0–0.7)
Eosinophil %: 0.1 %
HCT: 43.8 % (ref 40.0–52.0)
HGB: 14.7 g/dL (ref 13.0–18.0)
LYMPHS ABS: 0.7 x10 3/mm — AB (ref 1.0–3.6)
Lymphocyte %: 5.1 %
MCH: 31.5 pg (ref 26.0–34.0)
MCHC: 33.5 g/dL (ref 32.0–36.0)
MCV: 94 fL (ref 80–100)
MONOS PCT: 6.8 %
Monocyte #: 0.9 x10 3/mm (ref 0.2–1.0)
NEUTROS ABS: 11.9 x10 3/mm — AB (ref 1.4–6.5)
NEUTROS PCT: 87.8 %
PLATELETS: 211 x10 3/mm (ref 150–440)
RBC: 4.66 10*6/uL (ref 4.40–5.90)
RDW: 17.7 % — ABNORMAL HIGH (ref 11.5–14.5)
WBC: 13.6 x10 3/mm — AB (ref 3.8–10.6)

## 2014-05-20 LAB — CK TOTAL AND CKMB (NOT AT ARMC)
CK, TOTAL: 23 U/L — AB
CK-MB: 1.5 ng/mL (ref 0.5–3.6)

## 2014-05-20 LAB — CBC
HCT: 40.3 % (ref 40.0–52.0)
HGB: 13.3 g/dL (ref 13.0–18.0)
MCH: 31.2 pg (ref 26.0–34.0)
MCHC: 33 g/dL (ref 32.0–36.0)
MCV: 94 fL (ref 80–100)
PLATELETS: 162 10*3/uL (ref 150–440)
RBC: 4.27 10*6/uL — AB (ref 4.40–5.90)
RDW: 17 % — ABNORMAL HIGH (ref 11.5–14.5)
WBC: 12.1 10*3/uL — ABNORMAL HIGH (ref 3.8–10.6)

## 2014-05-20 LAB — BASIC METABOLIC PANEL
ANION GAP: 7 (ref 7–16)
BUN: 17 mg/dL (ref 7–18)
CHLORIDE: 97 mmol/L — AB (ref 98–107)
CREATININE: 0.7 mg/dL (ref 0.60–1.30)
Calcium, Total: 7.4 mg/dL — ABNORMAL LOW (ref 8.5–10.1)
Co2: 30 mmol/L (ref 21–32)
EGFR (African American): >60
EGFR (Non-African Amer.): >60
GLUCOSE: 100 mg/dL — AB (ref 65–99)
OSMOLALITY: 270 (ref 275–301)
POTASSIUM: 4.1 mmol/L (ref 3.5–5.1)
Sodium: 134 mmol/L — ABNORMAL LOW (ref 136–145)

## 2014-05-20 LAB — TROPONIN I: Troponin-I: <0.02 ng/mL

## 2014-05-20 LAB — PROTIME-INR
INR: 1.1
Prothrombin Time: 13.9 secs (ref 11.5–14.7)

## 2014-05-20 LAB — LIPASE, BLOOD: Lipase: 52 U/L — ABNORMAL LOW (ref 73–393)

## 2014-05-21 ENCOUNTER — Observation Stay: Payer: Self-pay | Admitting: Specialist

## 2014-05-21 LAB — CK-MB
CK-MB: 0.9 ng/mL (ref 0.5–3.6)
CK-MB: 1 ng/mL (ref 0.5–3.6)

## 2014-05-21 LAB — TROPONIN I: Troponin-I: <0.02 ng/mL

## 2014-05-22 LAB — LIPID PANEL
Cholesterol: 139 mg/dL (ref 0–200)
HDL: 28 mg/dL — AB (ref 40–60)
Ldl Cholesterol, Calc: 77 mg/dL (ref 0–100)
TRIGLYCERIDES: 172 mg/dL (ref 0–200)
VLDL CHOLESTEROL, CALC: 34 mg/dL (ref 5–40)

## 2014-05-25 ENCOUNTER — Ambulatory Visit: Payer: Self-pay | Admitting: Hematology and Oncology

## 2014-06-04 ENCOUNTER — Inpatient Hospital Stay: Payer: Self-pay | Admitting: Internal Medicine

## 2014-06-04 LAB — TROPONIN I: Troponin-I: <0.02 ng/mL

## 2014-06-04 LAB — CBC WITH DIFFERENTIAL/PLATELET
BASOS PCT: 0.1 %
Basophil #: 0 10*3/uL (ref 0.0–0.1)
EOS ABS: 0 10*3/uL (ref 0.0–0.7)
Eosinophil %: 0.1 %
HCT: 36.5 % — ABNORMAL LOW (ref 40.0–52.0)
HGB: 12.7 g/dL — ABNORMAL LOW (ref 13.0–18.0)
LYMPHS ABS: 0.3 10*3/uL — AB (ref 1.0–3.6)
Lymphocyte %: 1.6 %
MCH: 31.8 pg (ref 26.0–34.0)
MCHC: 34.7 g/dL (ref 32.0–36.0)
MCV: 92 fL (ref 80–100)
Monocyte #: 0.2 x10 3/mm (ref 0.2–1.0)
Monocyte %: 1 %
Neutrophil #: 20 10*3/uL — ABNORMAL HIGH (ref 1.4–6.5)
Neutrophil %: 97.2 %
Platelet: 201 10*3/uL (ref 150–440)
RBC: 3.98 10*6/uL — ABNORMAL LOW (ref 4.40–5.90)
RDW: 15.6 % — AB (ref 11.5–14.5)
WBC: 20.6 10*3/uL — AB (ref 3.8–10.6)

## 2014-06-04 LAB — BASIC METABOLIC PANEL
Anion Gap: 11 (ref 7–16)
BUN: 21 mg/dL — ABNORMAL HIGH (ref 7–18)
CALCIUM: 6.7 mg/dL — AB (ref 8.5–10.1)
Chloride: 90 mmol/L — ABNORMAL LOW (ref 98–107)
Co2: 28 mmol/L (ref 21–32)
Creatinine: 1.06 mg/dL (ref 0.60–1.30)
EGFR (Non-African Amer.): >60
Glucose: 173 mg/dL — ABNORMAL HIGH (ref 65–99)
Osmolality: 266 (ref 275–301)
POTASSIUM: 3.4 mmol/L — AB (ref 3.5–5.1)
Sodium: 129 mmol/L — ABNORMAL LOW (ref 136–145)

## 2014-06-04 LAB — URINALYSIS, COMPLETE
Bilirubin,UR: NEGATIVE
Blood: NEGATIVE
Glucose,UR: NEGATIVE mg/dL (ref 0–75)
Hyaline Cast: 6
Ketone: NEGATIVE
Leukocyte Esterase: NEGATIVE
Nitrite: NEGATIVE
PH: 6 (ref 4.5–8.0)
Protein: 30
SPECIFIC GRAVITY: 1.012 (ref 1.003–1.030)
Squamous Epithelial: <1
WBC UR: 3 /HPF (ref 0–5)

## 2014-06-04 LAB — MAGNESIUM: Magnesium: 2 mg/dL

## 2014-06-04 LAB — PRO B NATRIURETIC PEPTIDE: B-TYPE NATIURETIC PEPTID: 3385 pg/mL — AB (ref 0–450)

## 2014-06-05 LAB — CBC WITH DIFFERENTIAL/PLATELET
BASOS PCT: 0.1 %
Basophil #: 0 10*3/uL (ref 0.0–0.1)
EOS ABS: 0 10*3/uL (ref 0.0–0.7)
Eosinophil %: 0 %
HCT: 34.4 % — ABNORMAL LOW (ref 40.0–52.0)
HGB: 12 g/dL — ABNORMAL LOW (ref 13.0–18.0)
LYMPHS PCT: 1.5 %
Lymphocyte #: 0.2 10*3/uL — ABNORMAL LOW (ref 1.0–3.6)
MCH: 32 pg (ref 26.0–34.0)
MCHC: 34.8 g/dL (ref 32.0–36.0)
MCV: 92 fL (ref 80–100)
MONO ABS: 0.2 x10 3/mm (ref 0.2–1.0)
Monocyte %: 1.3 %
Neutrophil #: 15.7 10*3/uL — ABNORMAL HIGH (ref 1.4–6.5)
Neutrophil %: 97.1 %
Platelet: 171 10*3/uL (ref 150–440)
RBC: 3.73 10*6/uL — AB (ref 4.40–5.90)
RDW: 16.4 % — ABNORMAL HIGH (ref 11.5–14.5)
WBC: 16.2 10*3/uL — ABNORMAL HIGH (ref 3.8–10.6)

## 2014-06-05 LAB — BASIC METABOLIC PANEL
ANION GAP: 8 (ref 7–16)
BUN: 20 mg/dL — AB (ref 7–18)
CHLORIDE: 89 mmol/L — AB (ref 98–107)
CO2: 32 mmol/L (ref 21–32)
CREATININE: 0.85 mg/dL (ref 0.60–1.30)
Calcium, Total: 6.4 mg/dL — CL (ref 8.5–10.1)
GLUCOSE: 103 mg/dL — AB (ref 65–99)
OSMOLALITY: 262 (ref 275–301)
Potassium: 3.4 mmol/L — ABNORMAL LOW (ref 3.5–5.1)
SODIUM: 129 mmol/L — AB (ref 136–145)

## 2014-06-05 LAB — ALBUMIN: Albumin: 2 g/dL — ABNORMAL LOW (ref 3.4–5.0)

## 2014-06-05 LAB — MAGNESIUM: MAGNESIUM: 2 mg/dL

## 2014-06-06 LAB — TROPONIN I
Troponin-I: <0.02 ng/mL
Troponin-I: <0.02 ng/mL

## 2014-06-06 LAB — CBC WITH DIFFERENTIAL/PLATELET
BASOS PCT: 1.2 %
Basophil #: 0.2 10*3/uL — ABNORMAL HIGH (ref 0.0–0.1)
EOS ABS: 0 10*3/uL (ref 0.0–0.7)
Eosinophil %: 0 %
HCT: 36.5 % — AB (ref 40.0–52.0)
HGB: 12.4 g/dL — AB (ref 13.0–18.0)
LYMPHS ABS: 0.3 10*3/uL — AB (ref 1.0–3.6)
Lymphocyte %: 1.6 %
MCH: 31.8 pg (ref 26.0–34.0)
MCHC: 33.9 g/dL (ref 32.0–36.0)
MCV: 94 fL (ref 80–100)
MONO ABS: 0.1 x10 3/mm — AB (ref 0.2–1.0)
Monocyte %: 0.9 %
NEUTROS ABS: 16.1 10*3/uL — AB (ref 1.4–6.5)
Neutrophil %: 96.3 %
Platelet: 181 10*3/uL (ref 150–440)
RBC: 3.89 10*6/uL — ABNORMAL LOW (ref 4.40–5.90)
RDW: 16.2 % — ABNORMAL HIGH (ref 11.5–14.5)
WBC: 16.7 10*3/uL — ABNORMAL HIGH (ref 3.8–10.6)

## 2014-06-06 LAB — COMPREHENSIVE METABOLIC PANEL
ALBUMIN: 2.1 g/dL — AB (ref 3.4–5.0)
Alkaline Phosphatase: 88 U/L
Anion Gap: 5 — ABNORMAL LOW (ref 7–16)
BUN: 16 mg/dL (ref 7–18)
Bilirubin,Total: 0.8 mg/dL (ref 0.2–1.0)
CO2: 35 mmol/L — AB (ref 21–32)
Calcium, Total: 6.9 mg/dL — CL (ref 8.5–10.1)
Chloride: 89 mmol/L — ABNORMAL LOW (ref 98–107)
Creatinine: 0.92 mg/dL (ref 0.60–1.30)
EGFR (African American): >60
GLUCOSE: 212 mg/dL — AB (ref 65–99)
Osmolality: 266 (ref 275–301)
Potassium: 3.7 mmol/L (ref 3.5–5.1)
SGOT(AST): 18 U/L (ref 15–37)
SGPT (ALT): 24 U/L
Sodium: 129 mmol/L — ABNORMAL LOW (ref 136–145)
TOTAL PROTEIN: 5.1 g/dL — AB (ref 6.4–8.2)

## 2014-06-06 LAB — MAGNESIUM: Magnesium: 2 mg/dL

## 2014-06-07 LAB — CBC WITH DIFFERENTIAL/PLATELET
Basophil #: 0.1 10*3/uL (ref 0.0–0.1)
Basophil %: 0.8 %
EOS ABS: 0 10*3/uL (ref 0.0–0.7)
Eosinophil %: 0 %
HCT: 33.8 % — ABNORMAL LOW (ref 40.0–52.0)
HGB: 11.9 g/dL — ABNORMAL LOW (ref 13.0–18.0)
LYMPHS ABS: 0.2 10*3/uL — AB (ref 1.0–3.6)
Lymphocyte %: 1.4 %
MCH: 32.2 pg (ref 26.0–34.0)
MCHC: 35.1 g/dL (ref 32.0–36.0)
MCV: 92 fL (ref 80–100)
Monocyte #: 0 x10 3/mm — ABNORMAL LOW (ref 0.2–1.0)
Monocyte %: 0.3 %
NEUTROS ABS: 13.2 10*3/uL — AB (ref 1.4–6.5)
NEUTROS PCT: 97.5 %
PLATELETS: 168 10*3/uL (ref 150–440)
RBC: 3.68 10*6/uL — ABNORMAL LOW (ref 4.40–5.90)
RDW: 16.3 % — ABNORMAL HIGH (ref 11.5–14.5)
WBC: 13.5 10*3/uL — ABNORMAL HIGH (ref 3.8–10.6)

## 2014-06-08 LAB — CBC WITH DIFFERENTIAL/PLATELET
Bands: 1 %
HCT: 33.7 % — ABNORMAL LOW (ref 40.0–52.0)
HGB: 11.9 g/dL — AB (ref 13.0–18.0)
LYMPHS PCT: 1 %
MCH: 32.2 pg (ref 26.0–34.0)
MCHC: 35.4 g/dL (ref 32.0–36.0)
MCV: 91 fL (ref 80–100)
MYELOCYTE: 1 %
Metamyelocyte: 3 %
Monocytes: 1 %
Platelet: 158 10*3/uL (ref 150–440)
RBC: 3.7 10*6/uL — ABNORMAL LOW (ref 4.40–5.90)
RDW: 16.1 % — AB (ref 11.5–14.5)
Segmented Neutrophils: 93 %
WBC: 14.9 10*3/uL — AB (ref 3.8–10.6)

## 2014-06-08 LAB — BASIC METABOLIC PANEL
ANION GAP: 9 (ref 7–16)
BUN: 18 mg/dL (ref 7–18)
CHLORIDE: 87 mmol/L — AB (ref 98–107)
CO2: 35 mmol/L — AB (ref 21–32)
CREATININE: 0.79 mg/dL (ref 0.60–1.30)
Calcium, Total: 8.4 mg/dL — ABNORMAL LOW (ref 8.5–10.1)
EGFR (African American): >60
EGFR (Non-African Amer.): >60
GLUCOSE: 104 mg/dL — AB (ref 65–99)
OSMOLALITY: 265 (ref 275–301)
Potassium: 3.5 mmol/L (ref 3.5–5.1)
Sodium: 131 mmol/L — ABNORMAL LOW (ref 136–145)

## 2014-06-09 DIAGNOSIS — R918 Other nonspecific abnormal finding of lung field: Secondary | ICD-10-CM | POA: Diagnosis not present

## 2014-06-09 LAB — CBC WITH DIFFERENTIAL/PLATELET
Bands: 4 %
HCT: 34.7 % — AB (ref 40.0–52.0)
HGB: 12.2 g/dL — AB (ref 13.0–18.0)
LYMPHS PCT: 3 %
MCH: 32.3 pg (ref 26.0–34.0)
MCHC: 35.2 g/dL (ref 32.0–36.0)
MCV: 92 fL (ref 80–100)
MONOS PCT: 2 %
Metamyelocyte: 3 %
Platelet: 133 10*3/uL — ABNORMAL LOW (ref 150–440)
RBC: 3.78 10*6/uL — AB (ref 4.40–5.90)
RDW: 16.2 % — ABNORMAL HIGH (ref 11.5–14.5)
SEGMENTED NEUTROPHILS: 88 %
WBC: 12.8 10*3/uL — ABNORMAL HIGH (ref 3.8–10.6)

## 2014-06-09 LAB — CULTURE, BLOOD (SINGLE)

## 2014-06-10 LAB — BASIC METABOLIC PANEL
ANION GAP: 6 — AB (ref 7–16)
BUN: 17 mg/dL (ref 7–18)
CALCIUM: 6.9 mg/dL — AB (ref 8.5–10.1)
CO2: 35 mmol/L — AB (ref 21–32)
CREATININE: 0.62 mg/dL (ref 0.60–1.30)
Chloride: 93 mmol/L — ABNORMAL LOW (ref 98–107)
EGFR (African American): >60
Glucose: 99 mg/dL (ref 65–99)
OSMOLALITY: 270 (ref 275–301)
Potassium: 3.7 mmol/L (ref 3.5–5.1)
Sodium: 134 mmol/L — ABNORMAL LOW (ref 136–145)

## 2014-06-11 LAB — BRONCHIAL WASH CULTURE

## 2014-06-24 ENCOUNTER — Ambulatory Visit: Payer: Self-pay | Admitting: Hematology and Oncology

## 2014-06-30 LAB — CULTURE, FUNGUS WITHOUT SMEAR

## 2014-07-25 DEATH — deceased

## 2014-11-15 NOTE — H&P (Signed)
PATIENT NAME:  Tanner Stone, Tanner Stone MR#:  [id number] DATE OF BIRTH:  03-30-1933  DATE OF ADMISSION:  05/21/2014  REFERRING PHYSICIAN:  Debbrah Alar, MD  PRIMARY CARE PHYSICIAN:  Enos Fling. Ilene Qua, MD  ONCOLOGIST:  [name], MD  CHIEF COMPLAINT:  Chest pain, abdominal pain.  HISTORY OF PRESENT ILLNESS:  This is a 81 year old Caucasian gentleman with a history of lung cancer, and it appears this was an angiosarcoma followed by Dr. Juliann Mule. He has been on carboplatin and Taxol with daily radiation therapy. He completed radiation therapy a few months ago. He is presenting with abdominal pain and chest pain. He describes the chest pain as being intermittent over the last 3 days' duration and has not been associated with activity. It is over the left chest and described only as "pain," nonradiating. No worsening or relieving factors. Eight out of 10 in intensity and associated with shortness of breath. He is also complaining of gradually worsening abdominal pain, left upper quadrant in location, constant, worse with p.o. intake, associated with nausea without vomiting. He is still passing flatus, but his abdomen is more distended than usual.   REVIEW OF SYSTEMS: CONSTITUTIONAL:  Denies fever; however, positive for fatigue and weakness.  EYES:  Denies blurred vision, double vision, or eye pain.  EARS, NOSE, AND THROAT:  Denies tinnitus, ear pain, or hearing loss.  RESPIRATORY:  Positive for shortness of breath and describes this as a cough.  CARDIOVASCULAR:  Positive for chest pain as described above. Denies any edema or palpitations.  GASTROINTESTINAL:  Positive for nausea and abdominal pain. Denies any vomiting or diarrhea.  GENITOURINARY:  Denies dysuria or hematuria.  ENDOCRINE:  Denies nocturia or thyroid problems.  HEMATOLOGIC AND LYMPHATIC:  Denies easy bruising or bleeding.  SKIN:  No rashes or lesions.  MUSCULOSKELETAL:  Denies pain in the neck, back, shoulders, knees, or hips or arthritic  symptoms.  NEUROLOGIC:  Denies paralysis or paresthesias.  PSYCHIATRIC:  Denies anxiety or depressive symptoms.   Otherwise, a full review of systems performed by me is negative.  PAST MEDICAL HISTORY:  Metastatic angiosarcoma, completed radiation therapy, currently on chemotherapy, followed by Dr. Juliann Mule of oncology; hyperlipidemia; gastroesophageal reflux disease; atrial fibrillation; hypertension.   SOCIAL HISTORY:  Remote tobacco abuse. Denies any alcohol or drug use.   FAMILY HISTORY:  Hypertension.   ALLERGIES:  Dairy products, but no known drug allergies.   HOME MEDICATIONS:  Dexamethasone 6 mg p.o. daily, aspirin 81 mg p.o. daily, OxyContin 30 mg p.o. b.i.d., Cartia extended release 120 mg p.o. b.i.d., metoprolol 25 mg p.o. b.i.d., lactulose 15 mL b.i.d. as needed for constipation, Senna-S 50/8.6 mg 2 tabs p.o. b.i.d., Prilosec 20 mg p.o. daily.   PHYSICAL EXAMINATION: VITAL SIGNS:  Temperature 97.4, heart rate 109, respirations 22, blood pressure 123/95, saturating 90% on room air. Weight 76.7 kg, BMI 26.5.  GENERAL:  Weak-appearing Caucasian gentleman currently in no acute distress.  HEAD:  Normocephalic, atraumatic.  EYES:  Pupils are equal, round, and reactive to light. Extraocular muscles are intact. No scleral icterus.  MOUTH:  Dry mucosal membranes. Dentition is intact. No abscess noted.  EARS, NOSE, AND THROAT:  Clear without exudates. No external lesions.  NECK:  Supple. No thyromegaly. No nodules. No JVD.  PULMONARY:  Clear to auscultation bilaterally without wheezes, rales, or rhonchi. No use of accessory muscles. Good respiratory effort.  CHEST:  Nontender to palpation.  CARDIOVASCULAR:  S1, S2. Regular rate and rhythm. No murmurs, rubs, or gallops. No  edema. Pedal pulses 2+ bilaterally.  GASTROINTESTINAL:  Soft. Minimal distention. Tenderness to palpation of the left upper quadrant with voluntary guarding; however, no rebound or motion tenderness. Hypoactive bowel  sounds present. No hepatosplenomegaly.  MUSCULOSKELETAL:  No swelling, clubbing, or edema. Range of motion is full in all extremities.  NEUROLOGIC:  Cranial nerves II through XII are intact. No gross focal neurologic deficits. Sensation is intact. Reflexes are intact.  SKIN:  No ulceration, lesions, rash, or cyanosis. Skin is warm and dry. Turgor is intact.  PSYCHIATRIC:  Mood and affect are within normal limits. The patient is alert and oriented x 3. Insight and judgment are intact.   LABORATORY DATA:  Sodium 134, potassium 4.1, chloride 97, bicarbonate 30, BUN 17, creatinine 0.7, glucose 100. Troponin is less than 0.02. WBC 12.1, hemoglobin 13.3, platelets 162.   IMAGING:  Chest x-ray performed reveals left lower lobe consolidation, which is persistent; no acute findings. CT of the chest, abdomen, and pelvis performed reveals left pleural effusion is actually decreased in size. There are lesions of the left hemithorax. Bilateral pulmonary nodules have actually improved. There is a left posterior chest wall mass, which has decreased in size. However, there is a new paraspinal mass on the left side of the upper back, concerning for skeletal muscle metastases. No disease noted in the abdomen or pelvis. There are also no signs of bowel obstruction.   ASSESSMENT AND PLAN:  A 81 year old Caucasian gentleman with history of lung cancer followed by Dr. Juliann Mule of oncology as well as atrial fibrillation, presenting with chest pain and abdominal pain.   1.  Chest pain. Admit to telemetry under observational status. Initiate aspirin and statin therapy. Trend cardiac enzymes x 2. Consult cardiology. He is actually supposed to have an appointment with Dr. Ubaldo Glassing today, and we will call him.  2.  Left upper quadrant abdominal pain. Suspect this is related to ileus due to his pain medications. We will initiate bowel regimen and also bowel rest.  3.  Atrial fibrillation, currently rate controlled. We will continue with  his rate-controlling agents.  4.  Gastroesophageal reflux disease.ppi therapy 5.  Venous thromboembolism prophylaxis with heparin subcutaneously.  CODE STATUS:  The patient is a full code.  TIME SPENT:  45 minutes.    ____________________________ Aaron Mose. Hower, MD dkh:nb D: 05/21/2014 00:50:23 ET T: 05/21/2014 02:52:48 ET JOB#: [id number]  cc: Aaron Mose. Hower, MD, <Dictator> DAVID Woodfin Ganja MD ELECTRONICALLY SIGNED 05/28/2014 15:11

## 2014-11-15 NOTE — Consult Note (Signed)
PATIENT NAME:  Tanner Stone, Tanner Stone MR#:  [id number] DATE OF BIRTH:  1933/01/31  DATE OF CONSULTATION:  05/21/2014  REFERRING PHYSICIAN:   Fonnie Jarvis. Ilene Qua, MD CONSULTING PHYSICIAN:  Tyller Bowlby D. Clayborn Bigness, MD  PRIMARY ONCOLOGIST:   [name], MD   INDICATION FOR CONSULTATION:   Chest pain.   HISTORY OF PRESENT ILLNESS:   The patient is a 81 year old white male with a history of lung cancer and angiosarcoma followed by Dr. Juliann Mule.  The patient is on carboplatin and Taxol with daily radiation therapy, completed radiation 2 months ago, presented with abdominal pain and chest pain.  He thinks it gas related, over the last 3-4 days.  It is not related to activity.  It radiates over the left side of the chest.  Nothing he does makes it worse or better.  Pain was an 8 out of 10.  There was some shortness of breath and also some abdominal discomfort, left upper quadrant, constant; worse when he eats, with some nausea. No vomiting.  He has had increased gas and complained of abdominal distention, so he came to the Emergency Room for evaluation.   REVIEW OF SYSTEMS:   Denies  nausea, no vomiting. Denies fever, chills, sweats. No weight loss. No weight gain  .  He has had some abdominal pain, increase of gas and belching.    PAST MEDICAL HSITORY:  Metastatic angiosarcoma, hyperlipidemia, esophageal reflux, atrial fibrillation, hypertension.   SOCIAL HISTORY: No tobacco abuse, no alcohol abuse.   FAMILY HISTORY: Hypertension.  ALLERGIES: DAIRY PRODUCTS.   MEDICATIONS: Dexamethasone 6 mg daily, aspirin 81 mg per day, OxyContin 30 mg twice a day, Cardizem 120 mg twice a day, metoprolol 5 mg twice a day, Lactulose 15  twice a day, Senna 2 tablets twice a day, Prilosec 20 mg a day.    PHYSICAL EXAMINATION: VITAL SIGNS: Blood pressure 130/90, pulse 100 and irregular, respiratory rate 18, afebrile.  HEENT: Normocephalic, atraumatic.  Pupils are equal, round, and reactive to light.  NECK: Supple. No significant  JVD, bruits or adenopathy.   He has a large   over the left side of his neck  LUNGS: Essentially clear with occasional rhonchi, no rales.  Mild dullness.  HEART:.  Systolic sem lsb   LABORATORY STUDIES: Sodium of 134, potassium 4. chloride , bicarbonate of 30, BUN 17, creatinine  IMAGING:  Chest x-ray reveals left lower lobe consolidation, which is   CT shows decreased effusion.  There is a left hemothorax, bilateral nodules; left posterior chest wall mass, decreasing in size since treatment. New paraspinous mass, left upper back;  possibly metastatic.  No sign of bowel obstruction on CT of the abdomen and pelvis.  ASSESSMENT:  Lung cancer,  possible metastatic disease; atrial fibrillation, abdominal pain, reflux, hyperlipidemia.   PLAN:  1.  Here to admit. Rule out myocardial infarction.  Continue current therapy for pain control. Continue possible antibiotic therapy for consolidation. Continue oncology  cancer including radiation and chemotherapy. 2.  Atrial fibrillation. Continue current therapy.   Rate control with metoprolol and Cardizem.  The patient is not on anticoagulation. Would consider short-term anticoagulation for now.  3.  Deep vein thrombosis prophylaxis. Consider  anticoagulation on local measures.  He is an extremely high risk for possible deep vein thrombosis with his cancer, metastatic disease and instability. 4.  Reflux, Prilosec therapy.  Consider Reglan. It is not clear whether this symptom is related to his cancer or chemotherapy .  5.  Consider echocardiogram for evaluation.  I do not recommend functional study at this point. Would recommend medical therapy for now. Consider GI evaluation; conservative medical therapy because of comorbid disease.     ____________________________ Loran Senters. Clayborn Bigness, MD ddc:jp D: 05/22/2014 08:38:00 ET T: 05/22/2014 11:16:44 ET JOB#: [id number]  cc: Charene Mccallister D. Clayborn Bigness, MD, <Dictator> Yolonda Kida MD ELECTRONICALLY SIGNED 06/23/2014  10:01

## 2014-11-15 NOTE — Discharge Summary (Signed)
PATIENT NAME:  Tanner Stone, Tanner Stone MR#:  [id number] DATE OF BIRTH:  04-Nov-1932  DATE OF ADMISSION:  05/21/2014 DATE OF DISCHARGE:  05/23/2014  For a detailed note, please refer to the history and physical done on admission by Dr. Valentino Stone.   DIAGNOSES AT DISCHARGE: Chest and abdominal pain, likely related to his underlying malignancy, metastatic non-small cell lung cancer, history of chronic atrial fibrillation, chronic constipation, gastroesophageal reflux disease, oral thrush.   CODE STATUS: The patient is being discharged on a low-sodium diet.   ACTIVITY: As tolerated.   FOLLOWUP: Tanner Stone in the next 1-2 weeks. Also follow up with Tanner Stone in the next 1-2 weeks.    DISCHARGE MEDICATIONS: Omeprazole 20 mg daily, Senokot 2 tablets b.i.d., Cardizem CD 1 capsule t.i.d., lactulose 30 mL b.i.d. as needed, metoprolol tartrate 25 mg b.i.d., OxyContin 30 mg b.i.d. as needed, dexamethasone 6 mg daily, aspirin 81 mg daily, nystatin swish-and-swallow 5 mL q. 6 hours x 4 days.   [place] COURSE: Tanner Caldron. Chism, MD from oncology and Tanner D. Clayborn Bigness, MD from cardiology.   PERTINENT STUDIES DONE DURING THE HOSPITAL COURSE ARE AS FOLLOWS: A CT scan of the chest, abdomen and pelvis done with contrast compared with the most recent PET CT from 04/01/2014 showed the left pleural effusion appears decreased in volume. Continued enhancing pleural lesions within the left hemithorax. Bilateral pulmonary nodules. Left posterior chest wall mass has decreased in size. New paraspinal mass within the left upper back, concerning for skeletal muscle metastases. No evidence of metastatic disease within the abdomen and pelvis.   HOSPITAL COURSE: This is a 81 year old male who presented to the hospital with chest pain.  1.  Chest pain. The most likely cause of the patient's chest pain was musculoskeletal or related to his malignancy. The patient was observed on telemetry, and had 3 sets of  cardiac markers checked, which were negative. His CT chest showed pulmonary nodules and pleural effusions with muscular metastases, all related to his underlying malignancy. The patient's pain was treated with some OxyContin and morphine, and it has improved. The patient has been seen by cardiology, who did not think that the patient needed any acute cardiac intervention as the cause of his chest pain was likely musculoskeletal in nature.  2.  Oral thrush. This was noticed on admission. The patient was started some on some nystatin swish-and-swallow and his symptoms have improved.  3.  Hypertension. The patient remained hemodynamically stable on his metoprolol and Cardizem. He will resume that.  4.  Chronic atrial fibrillation. The patient remained rate controlled on his metoprolol and Cardizem. The patient is no long term anticoagulation, but this is to be further discussed with his cardiologist as an outpatient, Dr. Ubaldo Stone.  5.  Gastroesophageal reflux disease. The patient was maintained on his Protonix. He will resume that.  6.  Hyperlipidemia. The patient was maintained on his atorvastatin. He will resume that.  7.  Metastatic lung cancer. The patient was seen by Tanner Stone from oncology. His CT scan shows improvement from his previous PET scan. There is no plan for any further chemotherapy at this point. Patient to follow-up with oncology as an outpatient, and continue chronic pain medications as stated.  8.  Chronic constipation. This was likely opiate-induced. The patient is on Senokot and lactulose, which he will continue. While in the hospital, he was given Dulcolax suppository and Relistor with some resolution of his constipation.   CODE STATUS: The patient is  a Full Code.   He is being discharged home with home health nursing and physical therapy services.   TIME SPENT: 40 minutes    ____________________________ Tanner Heman. Verdell Carmine, MD vjs:MT D: 05/23/2014 15:00:00 ET T: 05/23/2014 16:54:29  ET JOB#: [id number]  cc: Tanner Heman. Verdell Carmine, MD, <Dictator> Tanner Caldron. Chism, MD Fonnie Jarvis Ilene Qua, MD Henreitta Leber MD ELECTRONICALLY SIGNED 06/02/2014 11:10

## 2014-11-15 NOTE — Consult Note (Signed)
Brief Consult Note: Diagnosis: Angiosarcoma.   Comments: Discussed with Dr Roxy Horseman at [facility] today . He would like to await pathology results and plan surgery if sarcoma confirmed with resection lung nodule. Would do mediastinoscopy prior to surgery re hilar nodes He would prefer to defer radiation at this time for this patient. I discussed this with Mr Bearse today and cancelled his appt with Dr Baruch Gouty.  Electronic Signatures: Georges Mouse (MD)  (Signed 26-Jun-15 16:17)  Authored: Brief Consult Note   Last Updated: 26-Jun-15 16:17 by Georges Mouse (MD)

## 2014-11-15 NOTE — Consult Note (Signed)
PATIENT NAME:  Tanner Stone, BARD MR#:  [id number] DATE OF BIRTH:  1932-11-04  DATE OF CONSULTATION:  06/06/2014  REFERRING PHYSICIAN:  Dr. Bridgett Larsson  CONSULTING PHYSICIAN:  Dwayne D. Callwood, MD  CHIEF COMPLAINT: Weakness, shortness of breath, rapid atrial fibrillation.   HISTORY OF PRESENT ILLNESS: The patient is a 81 year old white male with history of lung cancer stage IV, congestive heart failure, hypertension, atrial fibrillation, GERD, came to the Emergency Room with shortness of breath. He got chemotherapy about 3 weeks ago and complains of weakness over the last 3 to 4 days. Has had low-grade fever, chills shortness of breath. No hemoptysis or hematemesis or orthopnea. He has had some leg edema. Has lost weight, poor appetite. Chest x-ray suggested a possible pneumonia infection. During the course of his hospitalization he developed an episode of rapid atrial fibrillation, has a known history of lung cancer, and cardiology consultation was recommended.   PAST MEDICAL HISTORY: Hypertension, atrial fibrillation, lung cancer, radiation and chemotherapy, GERD, hyperlipidemia.   SOCIAL HISTORY: Denies smoking or alcohol consumption.   FAMILY HISTORY: Hypertension.   PAST SURGICAL HISTORY: Appendectomy, hernia repair, and rotator cuff surgery.   ALLERGIES: DAIRY PRODUCTS.   HOME MEDICATIONS: Senna twice a day, OxyContin 15 mg every 8 hours as needed, omeprazole 20 a day, Lopressor 25 twice a day, lactulose 10 mg twice a day, dexamethasone 5 mg once a day, diltiazem 120 mg every 24 hours, aspirin 81 mg a day.   REVIEW OF SYSTEMS: Denies blackout spells. No syncope. No real nausea or vomiting. Has had some fever, chills, some occasional sweats. He has had some mild weight loss, decreased appetite. No weight gain. No hemoptysis. No hematemesis. No bright-red blood per rectum. Denies vision change. No hearing change. Denies (Dictation cough, weakness, fatigue. He has had palpitations and tachycardia. No  significant chest pain.   PHYSICAL EXAMINATION: VITAL SIGNS: Blood pressure 100/80, initial pulse in atrial fibrillation was 140 and now is about 100 , respiratory rate of 16, afebrile. On 2 liters  saturation of 96.  HEENT: Normocephalic, atraumatic. Pupils equal and reactive to light. NECK: Supple. No JVD.  LUNGS: Bilateral rhonchi.  bilaterally no wheezing. Decreased air movement.  HEART: Irregularly irregular. Systolic ejection murmur. PMI not displaced.  ABDOMEN: benigh   EXTREMITIES: Edema 2+.  NEUROLOGIC: Intact.   LABORATORY DATA: Imaging shows chest x-ray with bilateral pulmonary opacities, possible CHF, , cardiomegaly. UA was negative. White count 20,000, hemoglobin 12.7, platelet count of 201,000. Glucose 173, BUN 21, creatinine 1.06, sodium 129, potassium 3.4, chloride of 90, bicarbonate of 28, BUN of 3385, troponin less than 0.02. EKG: Atrial fibrillation, rate of about 100; when he was rapid it was about 150.   ASSESSMENT:  1.  Pneumonia, multilobar, community-acquired, possibly postobstructive.  2.  Lung cancer, stage IV.  3.  Congestive heart failure.  4.  Atrial fibrillation with rapid ventricular response.  5.  Hyponatremia.  6.  Hyperlipidemia.  7.  Weakness, fatigue.  8.  Mild hypotension.  9.  Elevated blood sugars.   PLAN:  Cardiac evalution 1antibx pna 2 02 for sob 3 agree with oncology 4 AFIB rate control 5 Anticoug for AFIB 6 ECHO sob afib 7 Consider bronch withBX 8 conservative cardiac w/u  ____________________________ Loran Senters. Clayborn Bigness, MD ddc:at D: 06/07/2014 13:10:30 ET T: 06/07/2014 13:32:50 ET JOB#: [id number]  cc: [name], MD, <Dictator> Yolonda Kida MD ELECTRONICALLY SIGNED 07/11/2014 11:47

## 2014-11-15 NOTE — Consult Note (Signed)
PATIENT NAME:  Tanner Stone, SMEAD MR#:  [id number] DATE OF BIRTH:  November 11, 1932  DATE OF CONSULTATION:  06/06/2014  REFERRING PHYSICIAN:   CONSULTING PHYSICIAN:  Dwayne D. Callwood, MD  ADDENDUM:   This is a continuation.   IMPRESSION:  1.  Pneumonia, community acquired,  possibly postobstructive.  2.  Lung cancer, stage IV.  3.  Congestive heart failure.  4.  Atrial fibrillation, chronic, paroxysmal, and poorly controlled.  5.  Hyponatremia.  6.  Hypokalemia.  7.  Elevated white count.  8.  Elevated sugar.  9.  Weakness.  10.   Shortness of breath.   PLAN:  1.  Continue current therapy.  2.  Agree with admission.  3.  Agree with antibiotic therapy, broad-spectrum, for community-acquired postobstructive pneumonia.  4.  Consider pulmonary input.  5.  Continue oncology involvement for stage IV cancer.  6.  He is getting chemotherapy, which could be contributing to his a diminished immune response, and now obstruction.  7.  Congestive heart failure symptoms. Recommend echocardiogram for assessment of LV function, atrial fibrillation with shortness of breath.  8.  Atrial fibrillation. Recommend rate control by increasing Cardizem or adding digoxin; since his blood pressure is low, may needs to go with digoxin instead. We will also consider amiodarone. Correct hyponatremia.  9.  Correct hypokalemia.  10.  Blood sugar should be monitored and controlled with short-acting insulin if necessary. 11.   Elevated white count is probably related to his pneumonia, and as such we will continue to treat the patient medically for now. No further cardiac studies at this point. Continue to follow. 12.  Continue anticoagulation short-term and may need it long term, once he is discharged.      ____________________________ Loran Senters Clayborn Bigness, MD ddc:MT D: 06/07/2014 13:15:00 ET T: 06/07/2014 13:52:08 ET JOB#: [id number]  cc: [name], MD, <Dictator> Yolonda Kida MD ELECTRONICALLY SIGNED  07/20/2014 11:43

## 2014-11-15 NOTE — Discharge Summary (Signed)
PATIENT NAME:  Tanner Stone, Tanner Stone MR#:  [id number] DATE OF BIRTH:  1933-03-27  DATE OF ADMISSION:  05/01/2014 DATE OF DISCHARGE:  05/02/2014  ADMISSION DIAGNOSIS: Pneumonia.   DISCHARGE DIAGNOSES:  1. Pneumonia likely bacterial.  2. History of essential hypertension.  3. Chronic atrial fibrillation.  4. Gastroesophageal reflux disease.    5. Leukocytosis.  6. Stage IV non-small cell carcinoma of the lung.   CONSULTATIONS: Dr. Juliann Mule.    LABORATORY DATA:  Blood cultures negative to date.   HOSPITAL COURSE: A 81 year old male who presented on October 8 with generalized weakness, fatigue, found to have a chest x-ray consistent with a left lower lobe pneumonia. For further details please refer to the H and P.   1.  Left lower lobe pneumonia presumed to be bacterial. The patient was placed on IV Levaquin, he actually did quite well with just Levaquin. He was also noted to have a slight developing pleural effusion. Oncology was consulted. The patient had a thoracentesis on 09/16 and as conversation with Dr. Juliann Mule if the patient continues to have shortness of breath and this pleural effusion he will refer him to Dr. Genevive Bi for outpatient pleurodesis. The patient improved with Levaquin and he will be discharged with Levaquin.  2.  Stage IV non-small carcinoma, progressive in nature with metastatic disease to his bones. We appreciate Dr. Juliann Mule consult from oncology. The patient will have followup early next week for ongoing chemotherapy.  3.  Essential hypertension. The patient will continue his outpatient medications including Cartia and metoprolol.  4.  Left shoulder pain secondary to metastatic disease. The patient will continue on dexamethasone.   DISCHARGE MEDICATIONS:  1. Omeprazole 20 mg daily.  2. Senna 2 tablets b.i.d.  3. Cartia 120 mg b.i.d.  4. Metoprolol 25 b.i.d.  5. OxyContin 30 mg q. 12 hours p.r.n. pain.  6. Dexamethasone 6 mg daily.  7. Levaquin 750 mg q. 24 hours x 7 days.    DISCHARGE DIET: Low sodium diet.   ACTIVITY: As tolerated.   DISCHARGE FOLLOWUP: The patient will follow up with Dr. Juliann Mule next week.   TIME SPENT: 35 minutes. The patient was stable for discharge.   PHYSICAL EXAMINATION:  VITAL SIGNS: Temperature 97.9, pulse 97, respirations 18, blood pressure 118/79, 93% on room air.  GENERAL: The patient is alert, oriented, speaking in full sentences, not in acute distress.  CARDIOVASCULAR: Regular rate and rhythm. No murmurs, gallops, or rubs.  PMI  is not displaced.  LUNGS: Clear to auscultation without crackles, rales, rhonchi, or wheezing. Normal to percussion. No egophony, no dullness to percussion, but he did have some decreased breath sounds actually at the left base.  ABDOMEN: Bowel sounds positive. Nontender, nondistended. No hepatosplenomegaly.  EXTREMITIES: No clubbing, cyanosis or edema.  NEUROLOGIC:  Cranial nerves II-XII are intact. No focal deficits.     ____________________________ Vineet Kinney P. Benjie Karvonen, MD spm:bu D: 05/02/2014 12:21:20 ET T: 05/02/2014 13:26:39 ET JOB#: [id number]  cc: Jacy Brocker P. Benjie Karvonen, MD, <Dictator> [name], MD Fonnie Jarvis Ilene Qua, MD Donell Beers Ajene Carchi MD ELECTRONICALLY SIGNED 05/02/2014 21:01

## 2014-11-15 NOTE — Consult Note (Signed)
Chief Complaint:  Subjective/Chief Complaint Patient resting comfortably in denies any pain feels reasonably well still weak and tired denies any significant palpitations or tachycardia   VITAL SIGNS/ANCILLARY NOTES: **Vital Signs.:   17-Nov-15 07:50  Vital Signs Type Pre Medication  Pulse Pulse 295  Systolic BP Systolic BP 284  Diastolic BP (mmHg) Diastolic BP (mmHg) 68  Mean BP 81  Pulse Ox % Pulse Ox % 96  Pulse Ox Activity Level  At rest  Oxygen Delivery 4L; Nasal Cannula  *Intake and Output.:   Daily 17-Nov-15 07:00  Grand Totals Intake:  0 Output:      Net:  0 24 Hr.:  0  Oral Intake      In:  0  Length of Stay Totals Intake:  1400 Output:  5050    Net:  -3650   Brief Assessment:  GEN well developed, well nourished, no acute distress   Cardiac Irregular  murmur present  + LE edema  --Gallop   Respiratory normal resp effort  rhonchi  crackles   Gastrointestinal Normal   Gastrointestinal details normal Soft  Nontender   EXTR negative cyanosis/clubbing, positive edema   Lab Results: Routine Chem:  17-Nov-15 04:20   Glucose, Serum 99  BUN 17  Creatinine (comp) 0.62  Sodium, Serum  134  Potassium, Serum 3.7  Chloride, Serum  93  CO2, Serum  35  Calcium (Total), Serum  6.9  Anion Gap  6  Osmolality (calc) 270  eGFR (African American) >60  eGFR (Non-African American) >60 (eGFR values <28m/min/1.73 m2 may be an indication of chronic kidney disease (CKD). Calculated eGFR, using the MRDR Study equation, is useful in  patients with stable renal function. The eGFR calculation will not be reliable in acutely ill patients when serum creatinine is changing rapidly. It is not useful in patients on dialysis. The eGFR calculation may not be applicable to patients at the low and high extremes of body sizes, pregnant women, and vegetarians.)  Result Comment CALCIUM - RESULTS VERIFIED BY REPEAT TESTING.  - NOTIFIED OF CRITICAL VALUE  - C/ PHYLLIS KING @0456   06-10-14..Marland KitchenJO  - READ-BACK PROCESS PERFORMED.  Result(s) reported on 10 Jun 2014 at 04:55AM.   Assessment/Plan:  Assessment/Plan:  Assessment IMP  status post bronchoscopy POD X 1  generalized weakness and fatigue  mild hypotension  atrial fibrillation rapid ventricular  response  stage IV lung cancer  pneumonia  elevated white count shortness of breath  cardiomyopathy hypo neutropenia  edema .   Plan PLAN  POSTOP bronchoscopy and possible biopsy continue Cardizem for atrial fibrillation rate control  CONTINUE digoxin for additional rate control if necessary  short-term anticoagulation with Lovenox  consider long-term anticoagulation with Coumadin or Xarelto  continue antibiotics for pneumonia  consider adding ACE-inhibitor for cardiomyopathy  Lasix therapy 4 shortness of breath  supplemental oxygen for shortness of breath   Correct hyponatremia   agree with palliative care   Electronic Signatures: CLujean AmelD (MD)  (Signed 17-Nov-15 16:29)  Authored: Chief Complaint, VITAL SIGNS/ANCILLARY NOTES, Brief Assessment, Lab Results, Assessment/Plan   Last Updated: 17-Nov-15 16:29 by CYolonda Kida(MD)

## 2014-11-15 NOTE — Consult Note (Signed)
PATIENT NAME:  Tanner Stone, KUHNERT MR#:  [id number] DATE OF BIRTH:  02-Jun-1933  DATE OF CONSULTATION:  04/08/2014  CONSULTING PHYSICIAN:  [name], MD  PRIMARY CARE PHYSICIAN:  Fonnie Jarvis. Ilene Qua, MD  CARDIOLOGIST:  Javier Docker. Fath, MD    INDICATION: Shortness of breath, atrial fibrillation.   HISTORY OF PRESENT ILLNESS: Tanner Stone is a 81 year old white male who came to the Emergency Room with shortness of breath and dyspnea. He has a history of known stage IV lung cancer, getting radiation therapy with Dr. Oliva Bustard as an outpatient. The patient has left shoulder pain with underlying lung cancer or metastases and was scheduled for radiation to that area. He has chronic atrial fibrillation, is on Cardizem, has had the dose increased to twice a day. The patient had shortness of breath the day before and he just got progressively worse. He finally came to the Emergency Room.  In the Emergency Room his atrial fibrillation rate was 130.  He was given 20 of Cardizem IV and then 60 p.o.  The patient was being admitted to the hospitalist.  Heart rate was down in the 100 range.  He felt better.  Does not use oxygen regularly, but has been short of breath with short ambulation. The patient is still able to care for himself, sees Dr. Ubaldo Glassing as an outpatient regularly for atrial fibrillation. The patient started with saturations of about 95 on room air with the wife at bedside. Denies any worsening pain, just dyspnea.   PAST MEDICAL HISTORY: Hypertension, hyperlipidemia, GERD, atrial fibrillation, lung cancer with pleural effusions.   PAST SURGICAL HISTORY: Shoulder surgery.   ALLERGIES:  DAIRY PRODUCTS.   PSYCHOSOCIAL HISTORY:  Lives with his wife. He used to smoke, but quit several years ago. Denies alcohol consumption, illicit drug use.   FAMILY HISTORY: Colon cancer.   HOME MEDICATIONS: Zofran 4 mg 3 times a day as needed, 1 gram every 8 hours, sucralfate 3 times a day as needed, senna 2 tablets 3 times  a day as needed, promethazine 25 mg as needed, oxycodone 30 mg every 12 hours as needed, omeprazole 20 mg a day as needed, losartan 100 one-half tablet once a day, diltiazem extended release 120 mg twice a day.   REVIEW OF SYSTEMS:   No blackout spells or syncope. Denies nausea or vomiting. No fever, no chills, no sweats, no weight loss, no weight gain, no hemoptysis, no hematemesis. No bright red blood per rectum. He had had shortness of breath, left shoulder pain, dyspnea.   PHYSICAL EXAMINATION: VITAL SIGNS: Blood pressure 109/79, pulse of 100, respiratory rate of 16, afebrile.  HEENT: Normocephalic, atraumatic. Pupils equal and reactive to light.  NECK: Supple. No significant JVD.  LUNGS: Dullness in the bases. Adequate air movement, mild rhonchi. No wheezing.  HEART: Irregularly regular. Systolic ejection murmur at the apex. PMI nondisplaced.  ABDOMEN: Benign.  EXTREMITY: Within normal limits.  NEUROLOGIC: Intact.  SKIN: Normal.   LABORATORY DATA: Glucose of 110, BNP 570, BUN of 12, creatinine 0.19, sodium 136, potassium 3.8, chloride is normal. His magnesium is 1.9. Troponin 0.02. TSH 3.03. White count of 10.7, hemoglobin 13.3, hematocrit 39.9, platelet count of 346,000. PT and INR are normal.   IMAGING: Chest x-ray: Left-sided large pleural effusion with right lung nodularity reflecting malignancy.   ASSESSMENT: Rapid atrial fibrillation, respiratory distress from shortness of breath, pleural effusions, lung cancer, hyperlipidemia, metastases to the bone in the shoulder, hypertension, pain from cancer, nausea.   PLAN: Agree with  admit. Rule out for myocardial infarction. Continue telemetry because of atrial fibrillation. Supplemental oxygen. Diuretics as necessary. Would consider a therapeutic tap because of shortness of breath or window because of his lung cancer. Hold off on anticoagulation for now. We will continue rate control with Cardizem. Continue blood pressure control. We will  continue to treat the patient for hypertension. Continue to treat the patient for weakness and fatigue. Continue pain control for lung cancer. Continue hyperlipidemia management. We will base further evaluation on the results of these recent studies, but we will try to treat the patient conservatively and consider palliative care consult.    ____________________________ Loran Senters. Clayborn Bigness, MD ddc:lr D: 04/09/2014 12:38:35 ET T: 04/09/2014 13:00:24 ET JOB#: [id number]  cc: [name], MD, <Dictator> Yolonda Kida MD ELECTRONICALLY SIGNED 05/09/2014 14:36

## 2014-11-15 NOTE — H&P (Signed)
PATIENT NAME:  Tanner Stone, Tanner Stone MR#:  [id number] DATE OF BIRTH:  1932-11-30  PRIMARY CARE PHYSICIAN: Fonnie Jarvis. Ilene Qua, MD  REFERRING PHYSICIAN:  Jon Gills. Lord, MD  CHIEF COMPLAINT: Weakness and shortness of breath for the past 3 days.   HISTORY OF PRESENT ILLNESS: A 81 year old Caucasian male with a history of lung cancer, stage IV, CHF, hypertension, atrial fibrillation, GERD, at present in the ED with the above chief complaint. The patient is alert, awake, oriented, in no acute distress. The patient said he got chemotherapy for lung cancer 3 weeks ag. The patient has been feeling weak, but for the past 3 days weakness has been worsening. In addition, the patient has a fever and chills and shortness of breath. The patient denies any wheezing or  hemoptysis.  He denies any orthopnea, nocturnal dyspnea, but has leg edema for the past 1 month. He said that he lost weight and has a poor appetite.   The patient's chest x-ray in the ED showed bilateral multifocal infection or edema. The patient was treated with vancomycin, Zosyn, in the ED.   PAST MEDICAL HISTORY: Hypertension, atrial fibrillation, lung cancer, stage IV, status post radiation and chemotherapy, GERD, hyperlipidemia.   SOCIAL HISTORY: Denies any smoking or drinking or illicit drugs.   FAMILY HISTORY: Hypertension.   PAST SURGICAL HISTORY: Appendectomy, hernia repair, rotator cuff surgery.   ALLERGIES: All Dairy Products.  HOME MEDICATIONS:  Senna Plus 50 mg-8.6 mg tablets, 2 tablets b.i.d., OxyContin 15 mg 1 tablet p.o. every 8-12 hours, omeprazole 20 mg p.o. one cap once a day, Lopressor 25 mg p.o. b.i.d., lactulose 10 grams per 15 mL oral syrup 15 mL b.i.d. p.r.n. for constipation, dexamethasone 4 mg p.o. 1.5 tablets once a day, Cartia XT 120 mg per 24 hours oral capsule 1 tablet b.i.d., aspirin 81 mg p.o. daily.   REVIEW OF SYSTEMS:  CONSTITUTIONAL: The patient has a fever, chills. No headache or dizziness, but has generalized  weakness and poor appetite.  EYES:  No double vision, blurred vision.  EARS, NOSE, AND THROAT: No postnasal drip, slurred speech, or dysphagia.  CARDIOVASCULAR: No chest pain, palpitation, orthopnea, or nocturnal dyspnea, but has leg edema.  PULMONARY: Positive for cough, shortness of breath, but no sputum or hemoptysis. No wheezing.  GASTROINTESTINAL: No abdominal pain, nausea, vomiting, diarrhea. No melena or bloody stool.  GENITOURINARY: No dysuria, hematuria, or incontinence.  SKIN: No rash or jaundice.  NEUROLOGY: No syncope, loss of consciousness, or seizure.  ENDOCRINE: No polyuria, polydipsia, heat or cold intolerance.  HEMATOLOGY: No easy bruising or bleeding.   PHYSICAL EXAMINATION: VITAL SIGNS: Temperature 98.1, blood pressure 101/81, pulse 85, oxygen saturation 98% on 2 liters oxygen.  GENERAL: The patient is alert, awake, oriented, in no acute distress.  HEENT: Pupils round, equal and reactive to light and accommodation. Moist oral mucosa. Clear oropharynx.  NECK: Supple. No JVD or carotid bruits. No lymphadenopathy. No thyromegaly. CARDIOVASCULAR: S1, S2 is regular rate and rhythm. No murmurs or gallop.  PULMONARY: Bilateral air entry, bilateral basilar crackles. No wheezing. No use of accessory muscle to breathe.  ABDOMEN: Soft. No distention or tenderness. No organomegaly. Bowel sounds present. EXTREMITIES: Bilateral lower extremity edema at 2-3+ on right lower extremity and about 1+ edema on left lower extremity.  No clubbing or cyanosis. Bilateral pedal pulses present.  SKIN: No rash or jaundice.  NEUROLOGIC: AO x 3. No focal deficit. Power 3-4/5. Sensation intact.   IMAGING: Chest x-ray showed bilateral pulmonary opacity may represent changes  of CHF or multifocal infection. Cardiac enlargement and atherosclerotic disease.   LABORATORY DATA:  Urinalysis is negative. WBC 20.6, hemoglobin 12.7, platelets  201, 000.  Glucose 173, BUN 21, creatinine 1.06. Sodium 129,  potassium 3.4, chloride 90, bicarbonate 28, anion gap 11. BNP 3385. Troponin less than 0.02.   EKG showed atrial fibrillation at 100 BPM.  IMPRESSIONS: 1.  Pneumonia, community-acquired pneumonia.   2.  Possible acute congestive heart failure, unknown type.  3.  Atrial fibrillation with rapid ventricular response.  4.  Hyponatremia.  5.  Hypokalemia.  6.  Lung cancer, stage 4.  PLAN OF TREATMENT: 1.  The patient will be admitted to the medical floor with telemonitor. For pneumonia, we will continue Zosyn and Levaquin and followup blood culture, sputum culture, CBC. The patient had leukocytosis, which is possibly due to pneumonia and dexamethasone.  2.  For possible congestive heart failure, we will get an echocardiogram and start Lasix intravenous 20 mg b.i.d. and get a cardiology consult from Dr. Ubaldo Glassing and monitor input and output.  3.  For atrial fibrillation with rapid ventricular response, we will continue Cardizem, but hold Lopressor due to patient's low blood pressure. In addition, I will start Lovenox for anticoagulation for atrial fibrillation and to follow up with Dr. Ubaldo Glassing for further recommendation.  4.  For hypokalemia, we will continue to monitor and follow up BMP and magnesium level, give potassium supplement.  5.  For hyponatremia, we will continue to monitor BMP, but no normal saline intravenous due to possible acute congestive heart failure.   I discussed the patient's condition and plan of treatment with the patient and the patient's wife.  The patient wants full code.   TIME SPENT: About 58 minutes.    ____________________________ Demetrios Loll, MD qc:LT D: 06/04/2014 16:38:22 ET T: 06/04/2014 17:07:37 ET JOB#: [id number]  cc: Demetrios Loll, MD, <Dictator> Demetrios Loll MD ELECTRONICALLY SIGNED 06/04/2014 19:52

## 2014-11-15 NOTE — H&P (Signed)
PATIENT NAME:  BROC, CASPERS MR#:  [id number] DATE OF BIRTH:  [birth year]  DATE OF ADMISSION:  05/01/2014  PRIMARY CARE PHYSICIAN: Domenick Gong, MD  CHIEF COMPLAINT: Generalized weakness and fatigue.   HISTORY OF PRESENT ILLNESS: Mr. Odonohue is a 81 year old Caucasian gentleman who has history of lung cancer with bony metastasis who is currently undergoing radiation therapy at the [facility] who comes in after he started having increasing weakness, fatigability and lack of energy. His appetite has gone down in the last couple of days. Work-up in the Emergency Room showed the patient has developed a left lower lobe pneumonia with possible left lower pleural effusion. The patient was recently admitted about a month with rapid A-fib and underwent paracentesis and about 2.5 liters of pleural fluid was removed. This was presumed because of his lung cancer. The patient has non-small cell metastatic lung cancer. He received IV antibiotics in the Emergency Room and is being admitted for further evaluation and management.   PAST MEDICAL HISTORY: 1.  Hyperlipidemia.  2.  GERD.  3.  Shingles.  4.  Chickenpox.  5.  Nephrolithiasis.  6.  Chronic A-fib. 7.  Basal cell skin cancer.  8.  Hypertension.  9.  Hernia repair.  10.  Appendectomy.  11.  Rotator cuff surgery.  12.  Smoking.   SOCIAL HISTORY: He quit about 6 years ago.   FAMILY HISTORY: Positive for hypertension.   ALLERGIES: ALL DAIRY PRODUCTS.  HOME MEDICATIONS: 1.  Senna plus 2 tablets b.i.d.  2.  OxyContin 30 mg p.o. b.i.d.  3.  Omeprazole 20 mg daily.  4.  Metoprolol tartrate 25 mg 1 tablet b.i.d.  5.  Losartan 100 mg daily.  6.  Dexamethasone 4 mg p.o. daily.  7.  Cartia XT 120 mg p.o. b.i.d.   REVIEW OF SYSTEMS: CONSTITUTIONAL: Positive for fatigue, weakness. No fever or weight loss.  EYES: No blurred or double vision or glaucoma.  ENT: No tinnitus, ear pain, hearing loss or postnasal drip.  RESPIRATORY: Positive for  shortness of breath. No hemoptysis, wheeze or cough.  CARDIOVASCULAR: No chest pain, orthopnea, edema. Positive for hypertension.  GASTROINTESTINAL: No nausea, vomiting, diarrhea, abdominal pain or GERD.  GENITOURINARY: No dysuria, hematuria, frequency.  ENDOCRINE: No polyuria, nocturia or thyroid problems.  HEMATOLOGY: No anemia or easy bruising or bleeding.  SKIN: No acne, rash, or lesion.  MUSCULOSKELETAL: Positive for arthritis and back pain.  NEUROLOGIC: No CVA, TIA or dementia.  PSYCHIATRIC: No anxiety, depression. All other systems reviewed and negative.   PHYSICAL EXAMINATION: GENERAL: The patient is awake, alert and oriented x3, not in acute distress. VITAL SIGNS: He is afebrile. Pulse is 79, blood pressure 116/81, and pulse ox 96% on room air.  HEENT: Atraumatic, normocephalic. Pupils are equal, round and reactive to light and accommodation. EOM intact. Oral mucosa is moist.  NECK: Supple. No JVD. No carotid bruit.  RESPIRATORY: There are decreased breath sounds from lower mid left lung. No rales, rhonchi, respiratory distress or labored breathing.  HEART: Both the heart sounds are normal, irregularly irregular rhythm. No murmur heard. PMI not lateralized. Chest nontender.  EXTREMITIES: Good pedal pulses. Good femoral pulses. Trace lower extremity edema.  ABDOMEN: Soft, benign, nontender. No organomegaly. Positive bowel sounds.  NEUROLOGIC: Grossly intact cranial nerves II through XII. No motor or sensory deficit.  PSYCHIATRIC: The patient is awake, alert and oriented x3.  SKIN: Warm and dry.   DIAGNOSTIC DATA: UA negative for UTI.  Chest x-ray: Developing left lower lobe  infiltrate consistent with pneumonia, moderate-sized left pleural effusion, sclerotic lesion of left scapula consistent with blastic metastatic disease in patient with known metastatic lung cancer. Mild colonic distention.   White count 15.2. Comprehensive metabolic panel within normal limits, except calcium  6.5 and albumin 2.8. Troponin less than 0.02.   EKG shows A-fib.   ASSESSMENT AND PLAN: A 81 year old, Mr. Dani, with history of metastatic non-small cell lung cancer, history of hypertension, and chronic atrial fibrillation who comes in with:  1.  Pneumonia, appears in the left lower lobe with moderate left-sided pleural effusion. Will admit the patient to the medical floor, start the patient on IV Levaquin, follow up blood cultures and sputum culture if the patient is able to give any sample. Follow fever curve and WBC count. Continue oxygen and p.r.n. DuoNebs. If the patient's respiratory status declines, consider repeating thoracentesis.  2.  Hypertension. Continue home medications, which are losartan, beta blockers and Cartia XT.  3.  Chronic atrial fibrillation. Rate appears stable. We will continue Cartia XT and Cardizem and metoprolol.  4.  Gastroesophageal reflux disease. On omeprazole.  5.  Leukocytosis secondary to pneumonia.  6.  Deep vein thrombosis prophylaxis. Subcutaneous heparin t.i.d.   The above was discussed with the patient and his wife who was present in the Emergency Room. The patient is a FULL code.   TIME SPENT: 50 minutes.  ____________________________ Hart Rochester Posey Pronto, MD sap:sb D: 05/01/2014 13:46:02 ET T: 05/01/2014 14:13:40 ET JOB#: [id number]  cc: Jalyn Dutta A. Posey Pronto, MD, <Dictator> Fonnie Jarvis. Ilene Qua, MD Ilda Basset MD ELECTRONICALLY SIGNED 05/12/2014 12:49

## 2014-11-15 NOTE — Discharge Summary (Signed)
PATIENT NAME:  Tanner Stone, Tanner Stone MR#:  [id number] DATE OF BIRTH:  12-11-1932  DATE OF ADMISSION:  04/08/2014 DATE OF DISCHARGE:  04/09/2014  DISCHARGE DIAGNOSES:  1. Atrial fibrillation with rapid ventricular rate.  2. Stage IV lung cancer with malignant left pleural effusion and shortness of breath.  3. Hypertension.  4. Gastroesophageal reflux disease.   PROCEDURES: Left thoracentesis with 2.5 liters of cloudy bloody fluid removed.   CONSULTANTS:  1. Dr. Juliann Mule.  2. Dr. Ermalinda Memos.  3. Dr. Clayborn Bigness.   ADMITTING HISTORY AND PHYSICAL: Please see detailed H and P dictated by Dr. Margaretmary Eddy. In brief a 81 year old Caucasian male patient with a history of stage IV lung cancer, presented to the hospital complaining of left-sided shoulder pain and shortness of breath. The patient was found to have a large malignant left pleural effusion, was admitted to the hospitalist service. Also the patient had atrial fibrillation with rapid ventricular rate.   HOSPITAL COURSE:  1.  Left malignant pleural effusion. The patient had a thoracentesis done of the left side with 2.5 liters of blood-tinged fluid taken out. The patient has had this effusion for a while, but worsened progressively. No studies were done as the patient does have proven malignant pleural effusion of stage IV lung cancer. The patient was afebrile, normal white count through the hospital stay. The patient feels significantly better after thoracentesis with his shortness of breath resolved.  2.  Atrial fibrillation with rapid ventricular rate. The patient does have history of chronic atrial fibrillation, with his shortness of breath and the left pleural effusion he had a rapid ventricular rate. Metoprolol was added to his regimen of Cardizem with which his heart rate is significantly improved, also helped by thoracentesis. The patient was seen by Dr. Clayborn Bigness in the hospital.  3.  Stage IV lung cancer with chronic pain.  He was seen by palliative care in  the hospital, medication changes were done. The patient's pain is much improved. Also put on a short course of Decadron.   Prior to discharge the patient's shortness of breath is improved. His lungs sound clear. He has ambulated well in the hallways and will be discharged back home in a fair condition.   DISCHARGE MEDICATIONS:  1. Cardizem CD 120 mg daily.  2. Metoprolol 25 mg b.i.d.  3. Sucralfate 1 gram daily.  4. Valacyclovir 1 gram oral 3 times a day.  5. Prochlorperazine 10 mg 3 times a day.  6. Promethazine 25 mg rectal suppository every 6 hours as needed.  7. Dexamethasone 4 mg oral once a day for 3 days.  8. Zofran ODT 4 mg oral 3 times a day as needed for nausea and vomiting.  9. Oxycodone 30 mg oral every 12 hours as needed.  10. Omeprazole 20 mg oral once a day.  11. Metoprolol 25 mg 2 times a day.   DISCHARGE INSTRUCTIONS: Regular diet.  Activity as tolerated. Follow up with primary care physician and at the cancer center in a week.   TIME SPENT ON DAY OF DISCHARGE IN DISCHARGE ACTIVITY: 40 minutes.     ____________________________ Leia Alf Jamy Cleckler, MD srs:bu D: 04/16/2014 15:41:50 ET T: 04/16/2014 20:03:16 ET JOB#: [id number]  cc: Alveta Heimlich R. Birdella Sippel, MD, <Dictator> Neita Carp MD ELECTRONICALLY SIGNED 05/05/2014 10:45

## 2014-11-15 NOTE — H&P (Signed)
PATIENT NAME:  Tanner Stone, Tanner Stone MR#:  [id number] DATE OF BIRTH:  [birth year]  DATE OF ADMISSION:  04/08/2014  PRIMARY CARE PHYSICIAN: Domenick Gong, MD  REFERRING PHYSICIAN:  Turner Daniels, MD  CARDIOLOGY: Audry Riles, MD   CHIEF COMPLAINT: Shortness of breath.   HISTORY OF PRESENT ILLNESS: The patient is a 81 year old patient who came into the ED with a chief complaint of shortness of breath.  The patient is just recently diagnosed with stage IV lung cancer and getting radiation therapies by Dr. Oliva Bustard as an outpatient.  The patient is having left shoulder pain from the underlying lung cancer.  The patient also has chronic history of atrial fibrillation and takes Cardizem on a regular basis. The patient's shortness of breath was started yesterday and as it has been getting worse, he came to the ED.  In the ED, the patient was in atrial fibrillation with RVR with a heart rate of 130.  The patient was given Cardizem 20 mg of IV bolus and 60 mg p.o. Cardizem and hospitalist team is called to admit the patient.  The heart rate was down to 100s during my examination.  The patient was resting comfortably. He does not use any oxygen on a regular basis, but lately he has been getting short of breath with short distances of ambulation.  But the patient is still taking care of himself.  He sees Dr. Ubaldo Glassing as an outpatient regarding his atrial fibrillation.  During my examination, the patient is saturating at 95% on room air and wife is at bedside. Denies any dizziness or loss of consciousness. No other complaints.   PAST MEDICAL HISTORY: Hypertension, hyperlipidemia, GERD, chronic atrial fibrillation, recent diagnosis of stage IV lung cancer.   PAST SURGICAL HISTORY: Shoulder surgery.  ALLERGIES:  ALL DAIRY PRODUCTS.    PSYCHOSOCIAL HISTORY: Lives at home with wife.  He used to smoke but quit smoking several years ago. Denies any alcohol or illicit drug usage.   FAMILY HISTORY: Has history of colon cancer.     HOME MEDICATIONS: Zofran for ODT, 5 mg 1 tablet p.o. 3 times a day, valacyclovir 1 gram p.o. 1 tablet p.o. every 8 hours, sucralfate 1 gram oral tablets p.o. 3 times a day, senna 2 tablets p.o. 2 times a day, promethazine 25 mg rectal suppository 1 every 6 hours as needed, Prochlorperazine 10 mg 1 tablet p.o. 1 tablet p.o. 3 times a day, oxycodone 30 mg p.o. every 12 hours, omeprazole 20 mg p.o. once daily, losartan 100 mg 1/2 tablet orally once a day, Cartia extended release 120 mg 1 capsule p.o. 2 times a day.   REVIEW OF SYSTEMS:  CONSTITUTIONAL: Denies any fever, but complaining of fatigue and weakness.  EYES: Denies blurry vision, double vision.  ENT: Denies epistaxis, discharge.   RESPIRATORY:  Denies cough, complaining of shortness of breath. No chronic obstructive pulmonary disease.  CARDIOVASCULAR: No chest pain or palpitations. Denies any syncope. The patient has a chronic history of atrial fibrillation.   GASTROINTESTINAL: Denies nausea, vomiting, diarrhea, abdominal pain.  GENITOURINARY: No dysuria, hematuria.   ENDOCRINE:  Denies polyuria, nocturia, thyroid problems.   HEMATOLOGIC: Denies anemia, easy bruising or bleeding, but has stage IV lung cancer.  INTEGUMENTARY: No acne, rash, lesions.  MUSCULOSKELETAL: Complaining of left shoulder pain probably from underlying lung cancer and he is getting radiation therapy for that. Denies any gout.  NEUROLOGIC: Denies vertigo, ataxia, dementia.  PSYCHIATRIC:  No ADD, OCD, bipolar disorder.   PHYSICAL EXAMINATION:  VITAL SIGNS:  Temperature 97.8, pulse 99, respirations 18, blood pressure 109/79, pulse oximetry is 96% on room air.  GENERAL APPEARANCE: Not in acute distress. Moderately built and nourished.  HEENT: Normocephalic, atraumatic. Pupils are equally reacting to light and accommodation. No scleral icterus. No conjunctival injection. No sinus tenderness. No postnasal drip. Moist mucous membranes.  NECK: Supple. No JVD, no thyromegaly.   Range of motion is intact.  LUNGS: Positive rales and rhonchi. Decreased breath sounds on the left side of the lung more than right side.  Moderate air entry on the right side of the lung.  CARDIOVASCULAR:   Irregular rate, irregular rhythm.  No murmurs and gallops ABDOMEN:  Positive in all 4 quadrants. Nontender, nondistended.  No hepatosplenomegaly. No masses.  NEUROLOGIC:  Alert and oriented x 3. Cranial nerves II through XII are grossly intact.  Motor and sensory are intact. Reflexes are 2+.  EXTREMITIES: No cyanosis. No clubbing.  SKIN: Warm to touch. Normal turgor. No rashes. No lesions.  MUSCULOSKELETAL: No joint effusion, tenderness, erythema.  PSYCHIATRIC: Normal mood and affect.   LABORATORY AND IMAGING STUDIES:  Glucose 110.  BNP was 570, BUN 12, creatinine 0.194, sodium 136, potassium 3.8, chloride normal.  GFR is normal.  Anion gap, serum osmolality are normal. Calcium 8.3, serum magnesium 1.9, troponin less than 0.02. TSH is normal at 3.03.  WBC 10.7, hemoglobin 13.3, hematocrit is 39.9, platelets are 346,000. PT 14.1, INR is 1.1.  Chest x-ray, portable left-sided large pleural effusion, right lung nodularity likely reflecting known malignancy when correlating with a recent PET scan dated 04/01/2014.   ASSESSMENT AND PLAN:   A 81 year old Caucasian male presenting to the ED with a chief complaint of shortness of breath for the past 2 days recently diagnosed with stage IV lung cancer with left shoulder pain from lung cancer, currently on radiation therapy, who is presenting to the ED.  In the ED, the patient is diagnosed with atrial fibrillation with RVR at 130.  1.  Acute respiratory distress secondary to atrial fibrillation with rapid ventricular response also component of left-sided malignant pleural effusion.  2.  Atrial fibrillation with rapid ventricular response.  The patient has history of atrial fibrillation status post Cardizem bolus in the ED which brought his heart rate down  to 100s.  We will provide him Cardizem IV p.r.n. and continue his home medication Cardizem extended-release p.o. once daily.  Consult is placed to Dr. Ubaldo Glassing.  3.  Stage IV lung cancer with malignant left-sided pleural effusion which is large in size.  The patient is getting radiation therapy by Dr. Oliva Bustard.  The patient is also complaining of left shoulder pain from the underlying lung cancer and radiation therapy probably from palliative care.  Currently, his pulse oximetry was at 95% on room air and the patient is resting comfortably.  We will provide oxygen via nasal cannula on as needed basis. Not concerning thoracentesis, at this time as the patient's pulse oximetry is at 90% on room air.  We will consult oncology, Dr. Oliva Bustard and palliative care.  If necessary, will put a consult thoracic surgeon Dr. Genevive Bi for pleural disease on an as needed basis.  4.  History of hyperlipidemia, gastroesophageal reflux disease and hypertension. Resume his home medications. We will provide gastrointestinal prophylaxis with Pepcid.  5.  Deep vein thrombosis prophylaxis with Lovenox subcutaneous.   Plan of care was discussed in detail with the patient and his wife at bedside. They both verbalized understanding of the plan. The patient is FULL CODE.  Wife is the medical power of attorney.   TOTAL TIME SPENT ON ADMISSION: 50 minutes.   ____________________________ Nicholes Mango, MD ag:DT D: 04/08/2014 13:00:23 ET T: 04/08/2014 13:49:57 ET JOB#: [id number]  cc: Nicholes Mango, MD, <Dictator> Nicholes Mango MD ELECTRONICALLY SIGNED 04/15/2014 13:27

## 2014-11-15 NOTE — Consult Note (Signed)
Chief Complaint:  Subjective/Chief Complaint Patient with significant rapid ventricular response atrial fibrillation now preop for bronchoscopy he states some tachycardia palpitation denies any pain shortness of breath is no worse.   VITAL SIGNS/ANCILLARY NOTES: **Vital Signs.:   16-Nov-15 11:48  Vital Signs Type Post-Procedure  Temperature Temperature (F) 97.9  Celsius 36.6  Temperature Source axillary  Pulse Pulse 109  Respirations Respirations 18  Systolic BP Systolic BP 94  Diastolic BP (mmHg) Diastolic BP (mmHg) 67  Mean BP 76  Pulse Ox % Pulse Ox % 95  Pulse Ox Activity Level  At rest  Oxygen Delivery 6L; Nasal Cannula  *Intake and Output.:   Daily 16-Nov-15 07:00  Grand Totals Intake:  360 Output:  900    Net:  -540 24 Hr.:  -540  Oral Intake      In:  360  Urine ml     Out:  900  Length of Stay Totals Intake:  1400 Output:  5050    Net:  -3650   Brief Assessment:  GEN well developed, well nourished, no acute distress   Cardiac Irregular  murmur present  + LE edema  --Gallop   Respiratory normal resp effort  rhonchi  crackles   Gastrointestinal Normal   Gastrointestinal details normal Soft  Nontender   EXTR negative cyanosis/clubbing, positive edema   Lab Results: Microbiology:  16-Nov-15 10:18   Pneumocytis Silver Stain ========== TEST NAME ==========  ========= RESULTS =========  = REFERENCE RANGE =  PNEUMOCYSTIS BY DFA  Pneumocystis Smear, DFA Pneumocystis Smear, DFA         [   Negative             ]          Negative               [facility]      No: [id number]           [address], [place], [place] [address]           Lindon Romp, MD         [phone]   Result(s) reported on 10 Jun 2014 at 01:48PM.  Routine Micro:  16-Nov-15 10:18   Organism Name GRAM NEGATIVE ROD  Organism Quantity LIGHT GROWTH  Micro Text Report BRONCHIAL [place] CULTURE   ORGANISM 1                LIGHT GROWTH GRAM NEGATIVE ROD   COMMENT                    ID TO FOLLOW SENSITIVITIES TO FOLLOW   GRAM STAIN                FEW WHITE BLOOD CELLS   GRAM STAIN                FEW GRAM POSITIVE COCCI   GRAM STAIN                RARE GRAM VARIABLE ROD   ANTIBIOTIC                       Micro Text Report FUNGUS CULTURE   COMMENT                   NO YEAST OR FUNGUS ISOLATED IN 24 HOURS   ANTIBIOTIC  Organism 1 LIGHT GROWTH GRAM NEGATIVE ROD  Culture Comment ID TO FOLLOW SENSITIVITIES TO FOLLOW  Culture Comment NO YEAST OR FUNGUS ISOLATED IN 24 HOURS  Result(s) reported on 10 Jun 2014 at 09:21AM.  Gram Stain 1 FEW WHITE BLOOD CELLS  Gram Stain 2 FEW GRAM POSITIVE COCCI  Gram Stain 3 RARE GRAM VARIABLE ROD  Result(s) reported on 10 Jun 2014 at 11:38AM.  Specimen Source bronch wash  Routine Chem:  15-Nov-15 05:04   Glucose, Serum  104  BUN 18  Creatinine (comp) 0.79  Sodium, Serum  131  Potassium, Serum 3.5  Chloride, Serum  87  CO2, Serum  35  Calcium (Total), Serum  8.4  Anion Gap 9  Osmolality (calc) 265  eGFR (African American) >60  eGFR (Non-African American) >60 (eGFR values <41m/min/1.73 m2 may be an indication of chronic kidney disease (CKD). Calculated eGFR, using the MRDR Study equation, is useful in  patients with stable renal function. The eGFR calculation will not be reliable in acutely ill patients when serum creatinine is changing rapidly. It is not useful in patients on dialysis. The eGFR calculation may not be applicable to patients at the low and high extremes of body sizes, pregnant women, and vegetarians.)  Routine Hem:  15-Nov-15 05:04   WBC (CBC)  14.9  RBC (CBC)  3.70  Hemoglobin (CBC)  11.9  Hematocrit (CBC)  33.7  Platelet Count (CBC) 158 (Result(s) reported on 08 Jun 2014 at 06:00AM.)  MCV 91  MCH 32.2  MCHC 35.4  RDW  16.1  Bands 1  Segmented Neutrophils 93  Lymphocytes 1  Monocytes 1  Metamyelocyte 3  Diff Comment 1 ANISOCYTOSIS  Diff Comment 2 POLYCHROMASIA   Diff Comment 3 LARGE PLATELETS  Myelocyte 1  Diff Comment 4 PLTS VARIED IN SIZE  Result(s) reported on 08 Jun 2014 at 06:00AM.  16-Nov-15 05:05   WBC (CBC)  12.8  RBC (CBC)  3.78  Hemoglobin (CBC)  12.2  Hematocrit (CBC)  34.7  Platelet Count (CBC)  133 (Result(s) reported on 09 Jun 2014 at 05:42AM.)  MCV 92  MCH 32.3  MCHC 35.2  RDW  16.2  Bands 4  Segmented Neutrophils 88  Lymphocytes 3  Monocytes 2  Metamyelocyte 3  Diff Comment 1 ANISOCYTOSIS  Diff Comment 2 PLTS VARIED IN SIZE  Diff Comment 3 TOXIC GRANULATION  Result(s) reported on 09 Jun 2014 at 05:42AM.   Radiology Results: XRay:    11-Nov-15 14:44, Chest Portable Single View  Chest Portable Single View   REASON FOR EXAM:    ELEVATED WBC, LUNG CA PT, WEAKNESS  COMMENTS:       PROCEDURE: DXR - DXR PORTABLE CHEST SINGLE VIEW  - Jun 04 2014  2:44PM     CLINICAL DATA:  Hypotension and shortness of breath. History of lung  cancer.    EXAM:  PORTABLE CHEST - 1 VIEW    COMPARISON:  05/20/2014    FINDINGS:  Exam detail is diminished due to low lung volumes and portable  technique.    The heart size is enlarged. Tortuous thoracic aorta containing  calcified atherosclerotic plaque. There is bilateral interstitial  and airspace opacities, new from previous exam.     IMPRESSION:  1. Bilateral pulmonary opacities which may represent changes of CHF  or multifocal infection.  2. Cardiac enlargement and atherosclerotic disease.      Electronically Signed    By: TKerby MoorsM.D.    On: 06/04/2014 14:48  Verified By: Angelita Ingles, M.D.,  [place]:    14-Nov-15 16:08, [place] Color Flow Doppler Upper Extrem Left (Arm)  [place] Color Flow Doppler Upper Extrem Left (Arm)   REASON FOR EXAM:    Swelling  COMMENTS:       PROCEDURE: [place]  - US DOPPLER UP EXTR LEFT  - Jun 07 2014  4:08PM     CLINICAL DATA:  81 year old male with left upper extremity swelling  and pain.    EXAM:  LEFT UPPER EXTREMITY VENOUS DOPPLER  ULTRASOUND    TECHNIQUE:  Gray-scale sonography with graded compression, as well as color  Doppler and duplex ultrasound were performed to evaluate the upper  extremity deep venous system from the level of the subclavian vein  and including the jugular, axillary, basilic, radial, ulnar and  upper cephalic vein. Spectral Doppler was utilized to evaluate flow  at rest and with distal augmentation maneuvers.    COMPARISON:  None.    FINDINGS:  Contralateral Subclavian Vein: Respiratory phasicity is normal and  symmetric with the symptomatic side. No evidence of thrombus. Normal  compressibility.    Internal Jugular Vein: No evidence of thrombus. Normal  compressibility, respiratory phasicity and response to augmentation.    Subclavian Vein: No evidence of thrombus. Normal compressibility,  respiratory phasicity and response to augmentation.    Axillary Vein: No evidence of thrombus. Normal compressibility,  respiratory phasicity and response to augmentation.    Cephalic Vein: Not visualized    Basilic Vein: Nonocclusive thrombus is identified within the basilic  vein.    Brachial Veins: No evidence of thrombus. Normal compressibility,  respiratory phasicity and response to augmentation.    Radial Veins: No evidence of thrombus. Normal compressibility,  respiratory phasicity and response to augmentation.  Ulnar Veins: No evidence of thrombus. Normal compressibility,  respiratory phasicity and response to augmentation.    Venous Reflux:  None visualized.    Other Findings:  None visualized.     IMPRESSION:  Nonocclusive thrombus within the basilic vein, a superficial vein.    No evidence of left upper extremity DVT.      Electronically Signed    By: Hassan Rowan M.D.    On: 06/07/2014 18:12         Verified By: Lura Em, M.D.,  Cardiology:    11-Nov-15 10:55, ECG  Ventricular Rate 100  Atrial Rate 100  QRS Duration 104  QT 330  QTc 425  R Axis 3  T Axis 12   ECG interpretation   Atrial fibrillation  Abnormal ECG  When compared with ECG of 22-May-2014 06:43,  No significant change was found  ----------unconfirmed----------  Confirmed by OVERREAD, NOT ([id number]), editor PEARSON, BARBARA ([id number]) on 06/05/2014 9:51:54 AM  ECG     12-Nov-15 15:41, Echo Doppler  Echo Doppler   REASON FOR EXAM:      COMMENTS:       PROCEDURE: [facility] - ECHO DOPPLER COMPLETE(TRANSTHOR)  - Jun 05 2014  3:41PM     RESULT: Echocardiogram Report    Patient Name:   Tanner Stone Date of Exam: 06/05/2014  Medical Rec #:  [id number]        Custom1:  Date of Birth:  07-May-1933    Height:       67.0 in  Patient Age:    64 years      Weight:       160.0 lb  Patient Gender: M  BSA:          1.84 m??    Indications: SOB  Sonographer:    Sherrie Sport RDCS  Referring Phys: Demetrios Loll    Sonographer Comments: Technically difficult study due to poor echo   windows.    Summary:   1. Left ventricular ejection fraction, by visual estimation, is 35 to   40%.   2. Moderately decreased global left ventricular systolic function.   3. Mildly increased left ventricular septal thickness.   4. Decreased left ventricular internal cavity size.   5. Mild mitral valve regurgitation.   6. Mild aortic valve sclerosis without stenosis.   7. Mild to moderate tricuspid regurgitation.   8. Mildly increased left ventricular posterior wall thickness.  2D AND M-MODE MEASUREMENTS (normal ranges within parentheses):  Left Ventricle:          Normal  IVSd (2D):      1.32 cm (0.7-1.1)  LVPWd (2D):     1.26 cm (0.7-1.1) Aorta/LA:                  Normal  LVIDd (2D):3.13 cm (3.4-5.7) Aortic Root (2D): 3.20 cm (2.4-3.7)  LVIDs (2D):     2.64 cm           Left Atrium (2D): 3.90 cm (1.9-4.0)  LV FS (2D):     15.7 %   (>25%)  LV EF (2D):     34.2 %   (>50%)                                    Right Ventricle:                             RVd (2D):  LV DIASTOLIC FUNCTION:  MV Peak E: 0.94 m/s E/e'  Ratio: 5.50                      Decel Time: 239 msec  SPECTRAL DOPPLER ANALYSIS (where applicable):  Mitral Valve:  MV P1/2 Time: 69.31 msec  MV Area, PHT: 3.17 cm??  Aortic Valve: AoV Max Vel: 0.90 m/s AoV Peak PG: 3.3 mmHg AoV Mean PG:  LVOT Vmax: 0.76 m/s LVOT VTI:  LVOT Diameter: 2.20 cm  AoV Area, Vmax: 3.22 cm?? AoV Area, VTI:  AoV Area, Vmn:  Tricuspid Valve and PA/RV Systolic Pressure: TR Max Velocity: 2.34 m/s RA   Pressure: 5 mmHg RVSP/PASP: 26.8 mmHg  Pulmonic Valve:  PV Max Velocity: 0.61 m/s PV Max PG: 1.5 mmHg PV Mean PG:    PHYSICIAN INTERPRETATION:  Left Ventricle: Not well seen. The left ventricular internal cavity size   was decreased. LV septal wall thickness was mildly increased. LV   posterior wall thickness was mildly increased. Global LV systolic   function was moderately decreased. Left ventricular ejection fraction, by     visual estimation, is 35 to 40%.  Right Ventricle: The right ventricle was not well seen. The right   ventricular size is normal. Global RV systolic function is low normal.  Left Atrium: The left atrium is normal in size.  Right Atrium: The right atrium is normal in size.  Pericardium: There is no evidence of pericardial effusion.  Mitral Valve: The mitral valve is normal in structure. Mild mitral valve   regurgitation is seen.  Tricuspid Valve: The tricuspid valve is normal. Mild to  moderate   tricuspid regurgitation is visualized. The tricuspid regurgitant velocity   is 2.34 m/s, and with an assumed right atrial pressure of 5 mmHg, the   estimated right ventricular systolic pressure is normal at 26.8 mmHg.  Aortic Valve: The aortic valve is normal. Mild aortic valve sclerosis is   present, with noevidence of aortic valve stenosis. No evidence of aortic   valve regurgitation is seen.  Pulmonic Valve: The pulmonic valve is normal. No indication of pulmonic   valve regurgitation.    [place] MD  Electronically signed by  [place] Lujean Amel MD  Signature Date/Time: 06/05/2014/5:59:43 PM    *** Final ***    IMPRESSION: .        Verified By: Yolonda Kida, M.D., MD   Assessment/Plan:  Assessment/Plan:  Assessment IMP  preop her bronch and possible biopsy  tachycardia related to the atrial fibrillation  mild hypotension  chronic atrial fibrillation rapid ventricular  response  stage IV lung cancer  post obstructive pneumonia  elevated white count  generalized weak  persistent dyspnea on exertion  moderate cardiomyopathy hypo neutropenia  edema .   Plan PLAN  1continue Cardizem for atrial fibrillation rate control 2 add digoxin 0.5 IV for additional rate control if necessary 3 short-term anticoagulation with Lovenox 4 consider long-term anticoagulation with Coumadin or Xarelto 5 continue antibiotics for pneumonia 6 consider adding ACE-inhibitor for cardiomyopathy 7 Lasix therapy 4 shortness of breath 8 supplemental oxygen for shortness of breath 9  Correct hyponatremia 10  add digoxin daily to help with rate control   Electronic Signatures: Lujean Amel D (MD)  (Signed 17-Nov-15 16:34)  Authored: Chief Complaint, VITAL SIGNS/ANCILLARY NOTES, Brief Assessment, Lab Results, Radiology Results, Assessment/Plan   Last Updated: 17-Nov-15 16:34 by Yolonda Kida (MD)

## 2014-11-15 NOTE — Consult Note (Signed)
Reason for Visit: Referred by Dr. Kallie Edward.  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 Eventually a chest x-ray and subsequent MRI were performed showing a large mass with destruction of the left seventh rib. Patient went on to have CT-guided biopsy which was positive for angiosarcoma. Head CT scan showed hypermetabolic activity in the region of the left posterior chest wall with this associated destruction of the sixth and seventh ribs. There is also 2 small hypermetabolic subpleural lesions in the chest and mildly hypermetabolic left hilar lymph nodes. He's been seen by surgical oncology who has deferred surgical resection based on probable metastatic disease. He seems today for consideration of radiation therapy. Continues on narcotic analgesics for his significant pain. He otherwise is having no other complaints at this time.  Past Hx:    Hyperlipidemia:    GERD:    Shingles:    chicken pox:    Kidney Stones:    Atrial Fibrillation:    hearing loss:    diplopia:    basil cell skin cancer:    hypertension:    Hernia Repair:    Appendectomy:    Rotator Cuff Surgery: Aug 2009   hernia repair: 1976  Past, Family and Social History:  Past Medical History positive   Cardiovascular atrial fibrillation; hyperlipidemia; hypertension   Gastrointestinal GERD   Genitourinary kidney stones   Neurological/Psychiatric hearing loss, diplopia   Past Surgical History appendectomy; herniorrhaphy repair, rotator cuff surgery   Past  Medical History Comments chickenpox, shingles   Family History positive   Family History Comments father expired from melanoma 2 sisters with cancer one with lung breast and 2 brothers with colon cancer   Social History noncontributory   Additional Past Medical and Surgical History accompanied by his wife today   Allergies:   All Dairy Products: GI Distress  Home Meds:  Home Medications: Medication Instructions Status  oxyCODONE 10 mg oral tablet, extended release 1 tab(s) orally every 12 hours Active  Senna Plus 50 mg-8.6 mg oral tablet 2 tab(s) orally once a day (at bedtime) Active  aspirin 81 1 tab(s)  once a day  Active  losartan 100 mg oral tablet 1 tab(s) orally once a day Active  omeprazole 20 mg oral delayed release capsule 1 cap(s) orally once a day Active  Cardizem 120 mg/24 hours oral capsule, extended release 2 cap(s) orally once a day Active   Review of Systems:  General negative   Performance Status (ECOG) 0   Skin negative   Breast negative   Ophthalmologic see HPI   ENMT negative   Respiratory and Thorax negative   Cardiovascular negative   Gastrointestinal negative   Genitourinary negative   Musculoskeletal negative   Neurological negative   Psychiatric negative   Hematology/Lymphatics negative   Endocrine negative   Allergic/Immunologic negative   Review of Systems   review of systems obtained from nurses notes  Nursing Notes:  Nursing Vital Signs and Chemo Nursing Nursing Notes: *CC Vital Signs Flowsheet:   01-Jul-15 13:46  Temp Temperature 95.6  Pulse Pulse 91  Respirations  Respirations 20  SBP SBP 138  DBP DBP 87  Pain Scale (0-10)  3  Current Weight (kg) (kg) 82.5  Height (cm) centimeters 170  BSA (m2) 1.9   Physical Exam:  General/Skin/HEENT:  General normal   Skin normal   Eyes normal   ENMT normal   Head and Neck normal   Additional PE well-developed male in NAD. Lungs are clear to A&P cardiac examination  shows irregular rate and rhythm. Abdomen is benign. Deep palpation of his lower back does elicit some pain. Motor sensory and DTR levels are equal and symmetric in upper and lower extremities. No axillary or supraclavicular adenopathy is identified.   Breasts/Resp/CV/GI/GU:  Respiratory and Thorax normal   Cardiovascular normal   Gastrointestinal normal   Genitourinary normal   MS/Neuro/Psych/Lymph:  Musculoskeletal normal   Neurological normal   Lymphatics normal   Other Results:  Radiology Results: LabUnknown:    15-Jun-15 14:03, PET/CT Scan Lung Cancer Diagnosis  PACS Image   Nuclear Med:  PET/CT Scan Lung Cancer Diagnosis   REASON FOR EXAM:    lt chest wall mass  COMMENTS:       PROCEDURE: PET - PET/CT DX LUNG CA  - Jan 06 2014  2:03PM     CLINICAL DATA:  Initial treatment strategy for left rib sarcoma.    EXAM:  NUCLEAR MEDICINE PET SKULL BASE TO THIGH    TECHNIQUE:  13.1 mCi F-18 FDG was injected intravenously. Full-ring PET imaging  was performed from the skull base to thigh after the radiotracer. CT  data was obtained and used for attenuation correction and anatomic  localization.  FASTING BLOOD GLUCOSE:  Value:99 mg/dl    COMPARISON:  Chest CT on 12/31/2013    FINDINGS:  NECK    No hypermetabolic lymph nodes in the neck.    CHEST    Left posterior chest wall mass with associated destruction of sixth  and seventh ribs is hypermetabolic, with SUV max of 21.5. Two small  hypermetabolic nodules are seen in the left posterior pleural space  inferior to this mass which had SUVs of 7.1 and 4.4, consistent with  pleural metastases.    Mildly hypermetabolic left hilar lymph nodes are seen with SUV max  of 3.2, consistent with hilar lymph node metastases. No  hypermetabolic mediastinal lymph nodes identified.    A 1.5 cm ill-defined pulmonary nodule in the lingula has increased  in size, currently measuring 1.5 cm on image 106 of series 3. This  has  hypermetabolic activity, with SUV max of 2.4. This could  represent a primary bronchogenic carcinoma or pulmonary metastasis.  No other suspicious pulmonary nodules are identified.    ABDOMEN/PELVIS    No abnormal hypermetabolic activity within the liver, pancreas,  adrenal glands, or spleen. No hypermetabolic lymph nodes in the  abdomen or pelvis.    SKELETON    No focal hypermetabolic activity to suggest skeletal metastasis.     IMPRESSION:  Hypermetabolic left posterior chest wall mass with associated  destruction of the sixth and seventh ribs, consistent with known  primary sarcoma.    Two small hypermetabolic metastases in the left pleural space  inferior to the primary sarcoma.  Mildly hypermetabolic left hilar lymph nodes, consistent with hilar  lymph node metastases.    Increased size of 1.5 cm ill-defined hypermetabolic pulmonary nodule  in the lingula, which could represent a primary bronchogenic  carcinoma or solitary pulmonary metastasis.    No evidence of extrathoracic metastatic disease.  Electronically Signed    By: Earle Gell M.D.    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 Patient is unresectable by surgical oncology opinion with large tumor with rib and chest wall involvement. Plan:   at this time like to try radiation therapy preoperative mode to try to shrink this to more resectable stage. We'll plan on delivering 5000 cGy over 5 weeks and reevaluate for response. Would use IM RT treatment planning and delivered a spare spinal cord which is in close proximity from being overdosed. Risks and benefits of treatment including skin reaction, fatigue, inclusion of some slight normal lung volume all explained in  detail to the patient. I do not see reason to do a biopsy of his lung nodules now since it has been presented at tumor conference and these would be significantly difficult to biopsy based on extremely small size. Should this lesion even after initial dose of radiation not be resectable would contemplate boost with either IM RT radiation or possible brachy therapy. We'll discuss the case further with medical oncology and concurrent chemotherapy may be a reasonable treatment option. Case was presented our weekly tumor conference where recommendation for preoperative radiation was made. I have set patient up for CT simulation early next week.  I would like to take this opportunity for allowing me to participate in the care of your patient..  CC Referral:  cc: Dr. Domenick Gong   Electronic Signatures: Baruch Gouty Roda Shutters (MD)  (Signed 01-Jul-15 15:31)  Authored: HPI, Diagnosis, Past Hx, PFSH, Allergies, Home Meds, ROS, Nursing Notes, Physical Exam, Other Results, Relevent Results, Encounter Assessment and Plan, CC Referring Physician   Last Updated: 01-Jul-15 15:31 by Armstead Peaks (MD)

## 2014-11-15 NOTE — Consult Note (Signed)
Brief Consult Note: Diagnosis: angiosarcoma pending final pathology report.   Comments: ?? Physician Comments Patient discussed at multidisciplinary conference today. PET CT hypermetabolic at site of rib lesion as well as small lesion Left lingula.Hilar LN equivocal. Consensus was for preoperative radiation therapy to rib angiosarcoma to help with pain whilst final pathology reviewed by [facility]. Once radiation complete will plan for surgical resection of primary rib lesion as well as small lung nodule. Hilar LN can be biopsied at time of surgery or CT repeated to assess if any change in size of hilar LN. Will discuss with Dr Donney Dice -thoracic surgery at [facility]. In terms of pain management will add Oxycontin extended release '10mg'$  po twice daily and if tolerated will increase to '20mg'$  po twice daily. Radiation will help with pain mangement as well. May benefit fromm seeing DR Phifer for pain control..  Electronic Signatures: Georges Mouse (MD)  (Signed 25-Jun-15 20:39)  Authored: Brief Consult Note   Last Updated: 25-Jun-15 20:39 by Georges Mouse (MD)

## 2014-11-15 NOTE — Consult Note (Signed)
Chief Complaint:  Subjective/Chief Complaint Short of breath today denies any pain does notice any palpitations or tachycardia states he feels reasonably well distally in and that   VITAL SIGNS/ANCILLARY NOTES: **Vital Signs.:   15-Nov-15 08:46  Vital Signs Type Pre Medication  Pulse Pulse 70  Systolic BP Systolic BP 99  Diastolic BP (mmHg) Diastolic BP (mmHg) 67  Mean BP 77  *Intake and Output.:   15-Nov-15 08:00  Oral Intake      In:  120  Percentage of Meal Eaten  40   Brief Assessment:  GEN well developed, well nourished, no acute distress   Cardiac Irregular  murmur present  + LE edema  --Gallop   Respiratory normal resp effort  rhonchi  crackles   Gastrointestinal Normal   Gastrointestinal details normal Soft  Nontender   EXTR negative cyanosis/clubbing, positive edema   Lab Results: Routine Chem:  15-Nov-15 05:04   Glucose, Serum  104  BUN 18  Creatinine (comp) 0.79  Sodium, Serum  131  Potassium, Serum 3.5  Chloride, Serum  87  CO2, Serum  35  Calcium (Total), Serum  8.4  Anion Gap 9  Osmolality (calc) 265  eGFR (African American) >60  eGFR (Non-African American) >60 (eGFR values <63m/min/1.73 m2 may be an indication of chronic kidney disease (CKD). Calculated eGFR, using the MRDR Study equation, is useful in  patients with stable renal function. The eGFR calculation will not be reliable in acutely ill patients when serum creatinine is changing rapidly. It is not useful in patients on dialysis. The eGFR calculation may not be applicable to patients at the low and high extremes of body sizes, pregnant women, and vegetarians.)  Routine Hem:  15-Nov-15 05:04   WBC (CBC)  14.9  RBC (CBC)  3.70  Hemoglobin (CBC)  11.9  Hematocrit (CBC)  33.7  Platelet Count (CBC) 158 (Result(s) reported on 08 Jun 2014 at 06:00AM.)  MCV 91  MCH 32.2  MCHC 35.4  RDW  16.1  Bands 1  Segmented Neutrophils 93  Lymphocytes 1  Monocytes 1  Metamyelocyte 3  Diff  Comment 1 ANISOCYTOSIS  Diff Comment 2 POLYCHROMASIA  Diff Comment 3 LARGE PLATELETS  Myelocyte 1  Diff Comment 4 PLTS VARIED IN SIZE  Result(s) reported on 08 Jun 2014 at [facility]   Radiology Results: XRay:    11-Nov-15 14:44, Chest Portable Single View  Chest Portable Single View   REASON FOR EXAM:    ELEVATED WBC, LUNG CA PT, WEAKNESS  COMMENTS:       PROCEDURE: DXR - DXR PORTABLE CHEST SINGLE VIEW  - Jun 04 2014  2:44PM     CLINICAL DATA:  Hypotension and shortness of breath. History of lung  cancer.    EXAM:  PORTABLE CHEST - 1 VIEW    COMPARISON:  05/20/2014    FINDINGS:  Exam detail is diminished due to low lung volumes and portable  technique.    The heart size is enlarged. Tortuous thoracic aorta containing  calcified atherosclerotic plaque. There is bilateral interstitial  and airspace opacities, new from previous exam.     IMPRESSION:  1. Bilateral pulmonary opacities which may represent changes of CHF  or multifocal infection.  2. Cardiac enlargement and atherosclerotic disease.      Electronically Signed    By: TKerby MoorsM.D.    On: 06/04/2014 14:48     Verified By: TAngelita Ingles M.D.,  [place]    14-Nov-15 16:08, [place] Flow Doppler Upper Extrem  Left (Arm)  [place] Color Flow Doppler Upper Extrem Left (Arm)   REASON FOR EXAM:    Swelling  COMMENTS:       PROCEDURE: [place]  - US DOPPLER UP EXTR LEFT  - Jun 07 2014  4:08PM     CLINICAL DATA:  81 year old male with left upper extremity swelling  and pain.    EXAM:  LEFT UPPER EXTREMITY VENOUS DOPPLER ULTRASOUND    TECHNIQUE:  Gray-scale sonography with graded compression, as well as color  Doppler and duplex ultrasound were performed to evaluate the upper  extremity deep venous system from the level of the subclavian vein  and including the jugular, axillary, basilic, radial, ulnar and  upper cephalic vein. Spectral Doppler was utilized to evaluate flow  at rest and with distal augmentation  maneuvers.    COMPARISON:  None.    FINDINGS:  Contralateral Subclavian Vein: Respiratory phasicity is normal and  symmetric with the symptomatic side. No evidence of thrombus. Normal  compressibility.    Internal Jugular Vein: No evidence of thrombus. Normal  compressibility, respiratory phasicity and response to augmentation.    Subclavian Vein: No evidence of thrombus. Normal compressibility,  respiratory phasicity and response to augmentation.    Axillary Vein: No evidence of thrombus. Normal compressibility,  respiratory phasicity and response to augmentation.    Cephalic Vein: Not visualized    Basilic Vein: Nonocclusive thrombus is identified within the basilic  vein.    Brachial Veins: No evidence of thrombus. Normal compressibility,  respiratory phasicity and response to augmentation.    Radial Veins: No evidence of thrombus. Normal compressibility,  respiratory phasicity and response to augmentation.  Ulnar Veins: No evidence of thrombus. Normal compressibility,  respiratory phasicity and response to augmentation.    Venous Reflux:  None visualized.    Other Findings:  None visualized.     IMPRESSION:  Nonocclusive thrombus within the basilic vein, a superficial vein.    No evidence of left upper extremity DVT.      Electronically Signed    By: Hassan Rowan M.D.    On: 06/07/2014 18:12         Verified By: Lura Em, M.D.,  Cardiology:    11-Nov-15 10:55, ECG  Ventricular Rate 100  Atrial Rate 100  QRS Duration 104  QT 330  QTc 425  R Axis 3  T Axis 12  ECG interpretation   Atrial fibrillation  Abnormal ECG  When compared with ECG of 22-May-2014 06:43,  No significant change was found  ----------unconfirmed----------  Confirmed by OVERREAD, NOT ([id number]), editor PEARSON, BARBARA ([id number]) on 06/05/2014 9:51:54 AM  ECG     12-Nov-15 15:41, Echo Doppler  Echo Doppler   REASON FOR EXAM:      COMMENTS:       PROCEDURE: [facility] - ECHO DOPPLER  COMPLETE(TRANSTHOR)  - Jun 05 2014  3:41PM     RESULT: Echocardiogram Report    Patient Name:   Tanner Stone Date of Exam: 06/05/2014  Medical Rec #:  [id number]        Custom1:  Date of Birth:  [birth year]    Height:       67.0 in  Patient Age:    81 years      Weight:       160.0 lb  Patient Gender: M             BSA:          1.84 m??    Indications:  SOB  Sonographer:    Sherrie Sport RDCS  Referring Phys: Demetrios Loll    Sonographer Comments: Technically difficult study due to poor echo   windows.    Summary:   1. Left ventricular ejection fraction, by visual estimation, is 35 to   40%.   2. Moderately decreased global left ventricular systolic function.   3. Mildly increased left ventricular septal thickness.   4. Decreased left ventricular internal cavity size.   5. Mild mitral valve regurgitation.   6. Mild aortic valve sclerosis without stenosis.   7. Mild to moderate tricuspid regurgitation.   8. Mildly increased left ventricular posterior wall thickness.  2D AND M-MODE MEASUREMENTS (normal ranges within parentheses):  Left Ventricle:          Normal  IVSd (2D):      1.32 cm (0.7-1.1)  LVPWd (2D):     1.26 cm (0.7-1.1) Aorta/LA:                  Normal  LVIDd (2D):3.13 cm (3.4-5.7) Aortic Root (2D): 3.20 cm (2.4-3.7)  LVIDs (2D):     2.64 cm           Left Atrium (2D): 3.90 cm (1.9-4.0)  LV FS (2D):     15.7 %   (>25%)  LV EF (2D):     34.2 %   (>50%)                                    Right Ventricle:                             RVd (2D):  LV DIASTOLIC FUNCTION:  MV Peak E: 0.94 m/s E/e' Ratio: 5.50                      Decel Time: 239 msec  SPECTRAL DOPPLER ANALYSIS (where applicable):  Mitral Valve:  MV P1/2 Time: 69.31 msec  MV Area, PHT: 3.17 cm??  Aortic Valve: AoV Max Vel: 0.90 m/s AoV Peak PG: 3.3 mmHg AoV Mean PG:  LVOT Vmax: 0.76 m/s LVOT VTI:  LVOT Diameter: 2.20 cm  AoV Area, Vmax: 3.22 cm?? AoV Area, VTI:  AoV Area, Vmn:  Tricuspid Valve and PA/RV Systolic  Pressure: TR Max Velocity: 2.34 m/s RA   Pressure: 5 mmHg RVSP/PASP: 26.8 mmHg  Pulmonic Valve:  PV Max Velocity: 0.61 m/s PV Max PG: 1.5 mmHg PV Mean PG:    PHYSICIAN INTERPRETATION:  Left Ventricle: Not well seen. The left ventricular internal cavity size   was decreased. LV septal wall thickness was mildly increased. LV   posterior wall thickness was mildly increased. Global LV systolic   function was moderately decreased. Left ventricular ejection fraction, by     visual estimation, is 35 to 40%.  Right Ventricle: The right ventricle was not well seen. The right   ventricular size is normal. Global RV systolic function is low normal.  Left Atrium: The left atrium is normal in size.  Right Atrium: The right atrium is normal in size.  Pericardium: There is no evidence of pericardial effusion.  Mitral Valve: The mitral valve is normal in structure. Mild mitral valve   regurgitation is seen.  Tricuspid Valve: The tricuspid valve is normal. Mild to moderate   tricuspid regurgitation is visualized. The tricuspid regurgitant velocity   is 2.34 m/s,  and with an assumed right atrial pressure of 5 mmHg, the   estimated right ventricular systolic pressure is normal at 26.8 mmHg.  Aortic Valve: The aortic valve is normal. Mild aortic valve sclerosis is   present, with noevidence of aortic valve stenosis. No evidence of aortic   valve regurgitation is seen.  Pulmonic Valve: The pulmonic valve is normal. No indication of pulmonic   valve regurgitation.    [place] MD  Electronically signed by Matheny MD  Signature Date/Time: 06/05/2014/5:59:43 PM    *** Final ***    IMPRESSION: .        Verified By: Yolonda Kida, M.D., MD   Assessment/Plan:  Assessment/Plan:  Assessment IMP  mild hypotension  chronic atrial fibrillation rapid ventricular  response  stage IV lung cancer  pneumonia  elevated white count  weakness  shortness of breath   cardiomyopathy hypo neutropenia  edema .   Plan PLAN  preop for bronchoscopy and possible biopsy 1continue Cardizem for atrial fibrillation rate control 2 add digoxin for additional rate control if necessary 3 short-term anticoagulation with Lovenox 4 consider long-term anticoagulation with Coumadin or Xarelto 5 continue antibiotics for pneumonia 6 consider adding ACE-inhibitor for cardiomyopathy 7 Lasix therapy 4 shortness of breath 8 supplemental oxygen for shortness of breath 9  Correct hyponatremia 10  agree with palliative care   Electronic Signatures: Lujean Amel D (MD)  (Signed [phone] 10:16)  Authored: Chief Complaint, VITAL SIGNS/ANCILLARY NOTES, Brief Assessment, Lab Results, Radiology Results, Assessment/Plan   Last Updated: 16-Nov-15 10:16 by Yolonda Kida (MD)

## 2014-11-15 NOTE — Consult Note (Signed)
Chief Complaint:  Subjective/Chief Complaint In complains of weakness shortness of breath no real and she denies any palpitations or tachycardia feels reasonably well small did does move around.   VITAL SIGNS/ANCILLARY NOTES: **Vital Signs.:   14-Nov-15 09:02  Vital Signs Type Pre Medication  Pulse Pulse 128  Respirations Respirations 20  Systolic BP Systolic BP 188  Diastolic BP (mmHg) Diastolic BP (mmHg) 83  Mean BP 94  Pulse Ox % Pulse Ox % 93  Pulse Ox Activity Level  At rest  Oxygen Delivery 6L  *Intake and Output.:   Daily 14-Nov-15 07:00  Grand Totals Intake:  600 Output:  450    Net:  150 24 Hr.:  150  Oral Intake      In:  600  Urine ml     Out:  450  Length of Stay Totals Intake:  600 Output:  2950    Net:  -2350    08:00  Oral Intake      In:  0  Percentage of Meal Eaten  0   Brief Assessment:  GEN well developed, well nourished, no acute distress   Cardiac Irregular  murmur present  + LE edema  --Gallop   Respiratory normal resp effort  rhonchi  crackles   Gastrointestinal Normal   Gastrointestinal details normal Soft  Nontender   EXTR negative cyanosis/clubbing, positive edema   Lab Results: LabObservation:  14-Nov-15 16:08   OBSERVATION Reason for Test Swelling  Hepatic:  13-Nov-15 13:42   Bilirubin, Total 0.8  Alkaline Phosphatase 88 (46-116 NOTE: New Reference Range 02/11/14)  SGPT (ALT) 24 (14-63 NOTE: New Reference Range 02/11/14)  SGOT (AST) 18  Total Protein, Serum  5.1  Albumin, Serum  2.1  Routine Chem:  13-Nov-15 13:42   Glucose, Serum  212  BUN 16  Creatinine (comp) 0.92  Sodium, Serum  129  Potassium, Serum 3.7  Chloride, Serum  89  CO2, Serum  35  Calcium (Total), Serum  6.9  Anion Gap  5  Osmolality (calc) 266  eGFR (African American) >60  eGFR (Non-African American) >60 (eGFR values <61m/min/1.73 m2 may be an indication of chronic kidney disease (CKD). Calculated eGFR, using the MRDR Study equation, is useful in   patients with stable renal function. The eGFR calculation will not be reliable in acutely ill patients when serum creatinine is changing rapidly. It is not useful in patients on dialysis. The eGFR calculation may not be applicable to patients at the low and high extremes of body sizes, pregnant women, and vegetarians.)  Result Comment CALCIUM - RESULTS VERIFIED BY REPEAT TESTING.  - NOTIFIED OF CRITICAL VALUE  - LAB/AMANDA PEREZ AT 1426 06/06/14  - READ-BACK PROCESS PERFORMED.  Result(s) reported on 06 Jun 2014 at 02:23PM.  Result Comment cbc - SMEAR SCANNED  Result(s) reported on 06 Jun 2014 at 02:51PM.  Magnesium, Serum 2.0 (1.8-2.4 THERAPEUTIC RANGE: 4-7 mg/dL TOXIC: > 10 mg/dL  -----------------------)  Cardiac:  13-Nov-15 13:42   Troponin I < 0.02 (0.00-0.05 0.05 ng/mL or less: NEGATIVE  Repeat testing in 3-6 hrs  if clinically indicated. >0.05 ng/mL: POTENTIAL  MYOCARDIAL INJURY. Repeat  testing in 3-6 hrs if  clinically indicated. NOTE: An increase or decrease  of 30% or more on serial  testing suggests a  clinically important change)    18:14   Troponin I < 0.02 (0.00-0.05 0.05 ng/mL or less: NEGATIVE  Repeat testing in 3-6 hrs  if clinically indicated. >0.05 ng/mL: POTENTIAL  MYOCARDIAL INJURY. Repeat  testing in 3-6 hrs if  clinically indicated. NOTE: An increase or decrease  of 30% or more on serial  testing suggests a  clinically important change)  Routine Hem:  13-Nov-15 13:42   WBC (CBC)  16.7  RBC (CBC)  3.89  Hemoglobin (CBC)  12.4  Hematocrit (CBC)  36.5  Platelet Count (CBC) 181  MCV 94  MCH 31.8  MCHC 33.9  RDW  16.2  Neutrophil % 96.3  Lymphocyte % 1.6  Monocyte % 0.9  Eosinophil % 0.0  Basophil % 1.2  Neutrophil #  16.1  Lymphocyte #  0.3  Monocyte #  0.1  Eosinophil # 0.0  Basophil #  0.2  14-Nov-15 06:02   WBC (CBC)  13.5  RBC (CBC)  3.68  Hemoglobin (CBC)  11.9  Hematocrit (CBC)  33.8  Platelet Count (CBC) 168  MCV 92   MCH 32.2  MCHC 35.1  RDW  16.3  Neutrophil % 97.5  Lymphocyte % 1.4  Monocyte % 0.3  Eosinophil % 0.0  Basophil % 0.8  Neutrophil #  13.2  Lymphocyte #  0.2  Monocyte #  0.0  Eosinophil # 0.0  Basophil # 0.1 (Result(s) reported on 07 Jun 2014 at 06:35AM.)   Radiology Results: XRay:    11-Nov-15 14:44, Chest Portable Single View  Chest Portable Single View   REASON FOR EXAM:    ELEVATED WBC, LUNG CA PT, WEAKNESS  COMMENTS:       PROCEDURE: DXR - DXR PORTABLE CHEST SINGLE VIEW  - Jun 04 2014  2:44PM     CLINICAL DATA:  Hypotension and shortness of breath. History of lung  cancer.    EXAM:  PORTABLE CHEST - 1 VIEW    COMPARISON:  05/20/2014    FINDINGS:  Exam detail is diminished due to low lung volumes and portable  technique.    The heart size is enlarged. Tortuous thoracic aorta containing  calcified atherosclerotic plaque. There is bilateral interstitial  and airspace opacities, new from previous exam.     IMPRESSION:  1. Bilateral pulmonary opacities which may represent changes of CHF  or multifocal infection.  2. Cardiac enlargement and atherosclerotic disease.      Electronically Signed    By: Kerby Moors M.D.    On: 06/04/2014 14:48     Verified By: Angelita Ingles, M.D.,  [place]:    14-Nov-15 16:08, [place] Color Flow Doppler Upper Extrem Left (Arm)  [place] Color Flow Doppler Upper Extrem Left (Arm)   REASON FOR EXAM:    Swelling  COMMENTS:       PROCEDURE: [place]  - US DOPPLER UP EXTR LEFT  - Jun 07 2014  4:08PM     CLINICAL DATA:  81 year old male with left upper extremity swelling  and pain.    EXAM:  LEFT UPPER EXTREMITY VENOUS DOPPLER ULTRASOUND    TECHNIQUE:  Gray-scale sonography with graded compression, as well as color  Doppler and duplex ultrasound were performed to evaluate the upper  extremity deep venous system from the level of the subclavian vein  and including the jugular, axillary, basilic, radial, ulnar and  upper cephalic vein.  Spectral Doppler was utilized to evaluate flow  at rest and with distal augmentation maneuvers.    COMPARISON:  None.    FINDINGS:  Contralateral Subclavian Vein: Respiratory phasicity is normal and  symmetric with the symptomatic side. No evidence of thrombus. Normal  compressibility.    Internal Jugular Vein: No evidence of thrombus. Normal  compressibility, respiratory phasicity and response to augmentation.    Subclavian Vein: No evidence of thrombus. Normal compressibility,  respiratory phasicity and response to augmentation.    Axillary Vein: No evidence of thrombus. Normal compressibility,  respiratory phasicity and response to augmentation.    Cephalic Vein: Not visualized    Basilic Vein: Nonocclusive thrombus is identified within the basilic  vein.    Brachial Veins: No evidence of thrombus. Normal compressibility,  respiratory phasicity and response to augmentation.    Radial Veins: No evidence of thrombus. Normal compressibility,  respiratory phasicity and response to augmentation.  Ulnar Veins: No evidence of thrombus. Normal compressibility,  respiratory phasicity and response to augmentation.    Venous Reflux:  None visualized.    Other Findings:  None visualized.     IMPRESSION:  Nonocclusive thrombus within the basilic vein, a superficial vein.    No evidence of left upper extremity DVT.      Electronically Signed    By: Hassan Rowan M.D.    On: 06/07/2014 18:12         Verified By: Lura Em, M.D.,  Cardiology:    11-Nov-15 10:55, ECG  Ventricular Rate 100  Atrial Rate 100  QRS Duration 104  QT 330  QTc 425  R Axis 3  T Axis 12  ECG interpretation   Atrial fibrillation  Abnormal ECG  When compared with ECG of 22-May-2014 06:43,  No significant change was found  ----------unconfirmed----------  Confirmed by OVERREAD, NOT ([id number]), editor PEARSON, BARBARA ([id number]) on 06/05/2014 9:51:54 AM  ECG     12-Nov-15 15:41, Echo Doppler  Echo Doppler    REASON FOR EXAM:      COMMENTS:       PROCEDURE: [facility] - ECHO DOPPLER COMPLETE(TRANSTHOR)  - Jun 05 2014  3:41PM     RESULT: Echocardiogram Report    Patient Name:   Tanner Stone Date of Exam: 06/05/2014  Medical Rec #:  [id number]        Custom1:  Date of Birth:  01-Dec-1932    Height:       67.0 in  Patient Age:    6 years      Weight:       160.0 lb  Patient Gender: M             BSA:          1.84 m??    Indications: SOB  Sonographer:    Sherrie Sport RDCS  Referring Phys: Demetrios Loll    Sonographer Comments: Technically difficult study due to poor echo   windows.    Summary:   1. Left ventricular ejection fraction, by visual estimation, is 35 to   40%.   2. Moderately decreased global left ventricular systolic function.   3. Mildly increased left ventricular septal thickness.   4. Decreased left ventricular internal cavity size.   5. Mild mitral valve regurgitation.   6. Mild aortic valve sclerosis without stenosis.   7. Mild to moderate tricuspid regurgitation.   8. Mildly increased left ventricular posterior wall thickness.  2D AND M-MODE MEASUREMENTS (normal ranges within parentheses):  Left Ventricle:          Normal  IVSd (2D):      1.32 cm (0.7-1.1)  LVPWd (2D):     1.26 cm (0.7-1.1) Aorta/LA:                  Normal  LVIDd (2D):3.13 cm (3.4-5.7) Aortic Root (2D): 3.20 cm (  2.4-3.7)  LVIDs (2D):     2.64 cm           Left Atrium (2D): 3.90 cm (1.9-4.0)  LV FS (2D):     15.7 %   (>25%)  LV EF (2D):     34.2 %   (>50%)                                    Right Ventricle:                             RVd (2D):  LV DIASTOLIC FUNCTION:  MV Peak E: 0.94 m/s E/e' Ratio: 5.50                      Decel Time: 239 msec  SPECTRAL DOPPLER ANALYSIS (where applicable):  Mitral Valve:  MV P1/2 Time: 69.31 msec  MV Area, PHT: 3.17 cm??  Aortic Valve: AoV Max Vel: 0.90 m/s AoV Peak PG: 3.3 mmHg AoV Mean PG:  LVOT Vmax: 0.76 m/s LVOT VTI:  LVOT Diameter: 2.20 cm  AoV Area, Vmax: 3.22  cm?? AoV Area, VTI:  AoV Area, Vmn:  Tricuspid Valve and PA/RV Systolic Pressure: TR Max Velocity: 2.34 m/s RA   Pressure: 5 mmHg RVSP/PASP: 26.8 mmHg  Pulmonic Valve:  PV Max Velocity: 0.61 m/s PV Max PG: 1.5 mmHg PV Mean PG:    PHYSICIAN INTERPRETATION:  Left Ventricle: Not well seen. The left ventricular internal cavity size   was decreased. LV septal wall thickness was mildly increased. LV   posterior wall thickness was mildly increased. Global LV systolic   function was moderately decreased. Left ventricular ejection fraction, by     visual estimation, is 35 to 40%.  Right Ventricle: The right ventricle was not well seen. The right   ventricular size is normal. Global RV systolic function is low normal.  Left Atrium: The left atrium is normal in size.  Right Atrium: The right atrium is normal in size.  Pericardium: There is no evidence of pericardial effusion.  Mitral Valve: The mitral valve is normal in structure. Mild mitral valve   regurgitation is seen.  Tricuspid Valve: The tricuspid valve is normal. Mild to moderate   tricuspid regurgitation is visualized. The tricuspid regurgitant velocity   is 2.34 m/s, and with an assumed right atrial pressure of 5 mmHg, the   estimated right ventricular systolic pressure is normal at 26.8 mmHg.  Aortic Valve: The aortic valve is normal. Mild aortic valve sclerosis is   present, with noevidence of aortic valve stenosis. No evidence of aortic   valve regurgitation is seen.  Pulmonic Valve: The pulmonic valve is normal. No indication of pulmonic   valve regurgitation.    Melvin MD  Electronically signed by [place] Lujean Amel MD  Signature Date/Time: 06/05/2014/5:59:43 PM    *** Final ***    IMPRESSION: .        Verified By: Yolonda Kida, M.D., MD   Assessment/Plan:  Assessment/Plan:  Assessment IMP 1 chronic atrial fibrillation rapid ventricular  response 2  stage IV lung cancer 3 pneumonia 4 elevated  white count 5 weakness 6 shortness of breath 7 cardiomyopathy 8 hypo neutropenia 9 edema .   Plan PLAN 1continue Cardizem for atrial fibrillation rate control 2 add digoxin for additional rate control if necessary 3 short-term anticoagulation with  Lovenox 4 consider long-term anticoagulation with Coumadin or Xarelto 5 continue antibiotics for pneumonia 6 consider adding ACE-inhibitor for cardiomyopathy 7 Lasix therapy 4 shortness of breath 8 supplemental oxygen for shortness of breath 9  Correct hyponatremia 10  consider palliative care   Electronic Signatures: Lujean Amel D (MD)  (Signed [phone] 10:05)  Authored: Chief Complaint, VITAL SIGNS/ANCILLARY NOTES, Brief Assessment, Lab Results, Radiology Results, Assessment/Plan   Last Updated: 16-Nov-15 10:05 by Yolonda Kida (MD)

## 2014-11-15 NOTE — Discharge Summary (Signed)
PATIENT NAME:  Tanner Stone, Tanner Stone MR#:  [id number] DATE OF BIRTH:  05/21/1933  DATE OF ADMISSION:  06/04/2014 DATE OF DISCHARGE:    ADMISSION DIAGNOSIS: Pneumonia, community-acquired.   DISCHARGE DIAGNOSES:  1.  Extended spectrum beta-lactamase pneumonia by bronchoscopy.  2.  Acute congestive heart failure.  3.  Atrial fibrillation with rapid ventricular response.  4.  Hypocalcemia.  5.  Hypokalemia.  6.  Stage IV lung cancer.  7.  Arm swelling, no evidence of deep vein thrombosis.   CONSULTATIONS:  1.  Dr. Clayborn Bigness from cardiology.  2.  Dr. Lavone Neri from oncology.  3.  Dr. Vilinda Boehringer from pulmonary.   PERTINENT LABORATORIES:  1.  Bronchoscopy wash study showed ESBL Escherichia coli.  2.  AFB from bronchial washings was negative.  3.  Discharge sodium 134, potassium 3.7, chloride 93, bicarbonate of 35, BUN 17, creatinine 0.62, glucose is 99.  5.  PCP from bronchial washings negative.  6.  The 2-D echocardiogram showed EF of 35-40% with mildly increased left ventricular systolic pressures and mild mitral valve regurgitation, mild aortic valve sclerosis, no stenosis, mild to moderate TR.   PHYSICAL EXAMINATION AT DISCHARGE:  VITAL SIGNS: Temperature 97.6, pulse is 92, respirations 20, blood pressure 143/90, 95% on 2 liters.  GENERAL: The patient is not in acute distress, appears critically ill.  LUNGS: There are decreased breath sounds at the bases without any crackles, rales, or rhonchi. Normal to percussion. Normal chest expansion.  CARDIOVASCULAR: Irregular rate and rhythm. There is a 2 out of 6 systolic ejection murmur heard best at the right sternal border without radiation.  ABDOMEN: Bowel sounds positive. Nontender, nondistended. No hepatosplenomegaly. EXTREMITIES: No clubbing, cyanosis, or edema.  NEUROLOGIC:  Cranial nerves II through XII were intact.    HOSPITAL COURSE: This is a 81 year old male with stage IV lung cancer who presented with weakness and shortness of breath,  found to have a pneumonia. For further details please refer to the H and P.   1.  Pneumonia, community-acquired. The patient actually underwent a bronchoscopy as he was not progressing and the bronchoscopy wash just came back today for ESBL. He was started on Zosyn and Levaquin initially, will now need a PICC line and Invanz for 14 days for ESBL pneumonia. We appreciate consultation with pulmonary.  2.  Acute congestive heart failure, systolic in nature with ejection fraction of 35-40%. The patient was initially placed on IV Lasix, has diuresed well. He will continue p.o. Lasix. 3.  Atrial fibrillation with RVR  due to problem number 1. His heart rate is now acceptable. We added digoxin. He will continue on Cardizem.  .  I did discuss long-term anticoagulation with the patient and family. They do not want to use Eliquis or Pradaxa or any agents except for aspirin.  They do recognize the increased risk of a stroke with just using aspirin, but due to his comorbidities and poor state they do not want to risk the chance of intracranial hemorrhage.  4.  Stage IV lung cancer. Oncology consult is appreciated.  5.  left Arm swelling. He had no DVT on ultrasound.  This is a superficial thrombophlebitis.   DISCHARGE MEDICATIONS:  1. Omeprazole 20 mg daily.   2. Senna Plus 2 tablets b.i.d.  3. Cartia 120 mg b.i.d.  4. Lactulose 15 mL b.i.d. p.r.n.  5. Metoprolol 25 b.i.d.  6. Dexamethasone 4 mg  1.5 tablet daily.  7. Aspirin 325 daily.  8. Oxycodone 15 mg q. 12 hours.  9.  Digoxin 250 mcg daily.  10. Lasix 20 mg b.i.d.  11. Invanz 1 gram IV q. 24 hours x 14 days.   The patient will be discharged with oxygen 2 liters nasal cannula.   DISCHARGE DIET: Regular diet. Discharge supplement, Ensure 3 times a day.    DISCHARGE REFERRALS: Hospice.   DISCHARGE FOLLOWUP: The patient should follow up in a week with Dr. Clayborn Bigness, Dr. Lavone Neri, and Dr. Ilene Qua.   TIME SPENT: Approximately 45 minutes on this  discharge.     ____________________________ Kacin Dancy P. Benjie Karvonen, MD spm:bu D: 06/11/2014 12:20:01 ET T: 06/11/2014 17:13:14 ET JOB#: [id number]  cc: Pio Eatherly P. Benjie Karvonen, MD, <Dictator> [name], MD Linus Mako, MD Fonnie Jarvis Ilene Qua, MD Donell Beers Delise Simenson MD ELECTRONICALLY SIGNED 06/11/2014 20:47

## 2015-01-15 ENCOUNTER — Ambulatory Visit: Payer: Medicare Other | Admitting: Cardiothoracic Surgery

## 2015-01-29 IMAGING — CT NM PET TUM IMG SKULL BASE T - THIGH
1 of 7 series · 1 of 25 positions shown · non-contrast
Comparison: Chest CT on 12/31/2013

CLINICAL DATA: Initial treatment strategy for left rib sarcoma.

EXAM:
NUCLEAR MEDICINE PET SKULL BASE TO THIGH
TECHNIQUE: 13.1 mCi F-18 FDG was injected intravenously. Full-ring PET imaging
was performed from the skull base to thigh after the radiotracer. CT
data was obtained and used for attenuation correction and anatomic
localization.
FASTING BLOOD GLUCOSE:  Value: 99 mg/dl

[Series 3: ct wb 5.0 b30f · axial · 5.0mm · 0.98mm/px · 1 of 329 slices shown]
[im 329/329  brain]
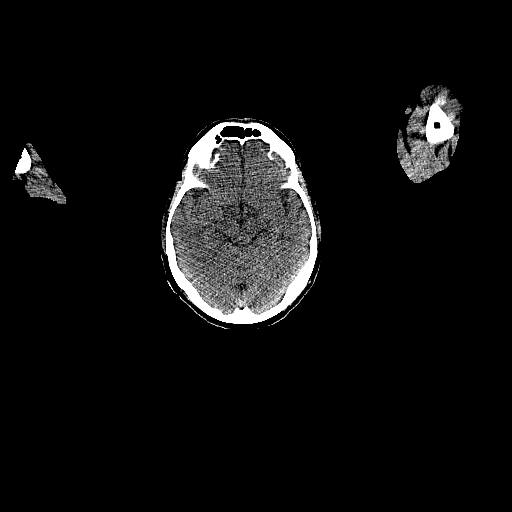

[1 of 25 positions shown; findings below may reference images not displayed]

FINDINGS: NECK

No hypermetabolic lymph nodes in the neck.

CHEST

Left posterior chest wall mass with associated destruction of sixth
and seventh ribs is hypermetabolic, with SUV max of 21.5. Two small
hypermetabolic nodules are seen in the left posterior pleural space
inferior to this mass which had SUVs of 7.1 and 4.4, consistent with
pleural metastases.

Mildly hypermetabolic left hilar lymph nodes are seen with SUV max
of 3.2, consistent with hilar lymph node metastases. No
hypermetabolic mediastinal lymph nodes identified.

A 1.5 cm ill-defined pulmonary nodule in the lingula has increased
in size, currently measuring 1.5 cm on image 106 of series 3. This
has hypermetabolic activity, with SUV max of 2.4. This could
represent a primary bronchogenic carcinoma or pulmonary metastasis.
No other suspicious pulmonary nodules are identified.

ABDOMEN/PELVIS

No abnormal hypermetabolic activity within the liver, pancreas,
adrenal glands, or spleen. No hypermetabolic lymph nodes in the
abdomen or pelvis.

SKELETON

No focal hypermetabolic activity to suggest skeletal metastasis.
IMPRESSION: Hypermetabolic left posterior chest wall mass with associated
destruction of the sixth and seventh ribs, consistent with known
primary sarcoma.

Two small hypermetabolic metastases in the left pleural space
inferior to the primary sarcoma.

Mildly hypermetabolic left hilar lymph nodes, consistent with hilar
lymph node metastases.

Increased size of 1.5 cm ill-defined hypermetabolic pulmonary nodule
in the lingula, which could represent a primary bronchogenic
carcinoma or solitary pulmonary metastasis.

No evidence of extrathoracic metastatic disease.

## 2015-06-27 IMAGING — CR DG CHEST 1V PORT
1 series · 1 of 1 positions shown · non-contrast
Comparison: 05/20/2014

CLINICAL DATA: Hypotension and shortness of breath. History of lung
cancer.

EXAM:
PORTABLE CHEST - 1 VIEW

[ap]
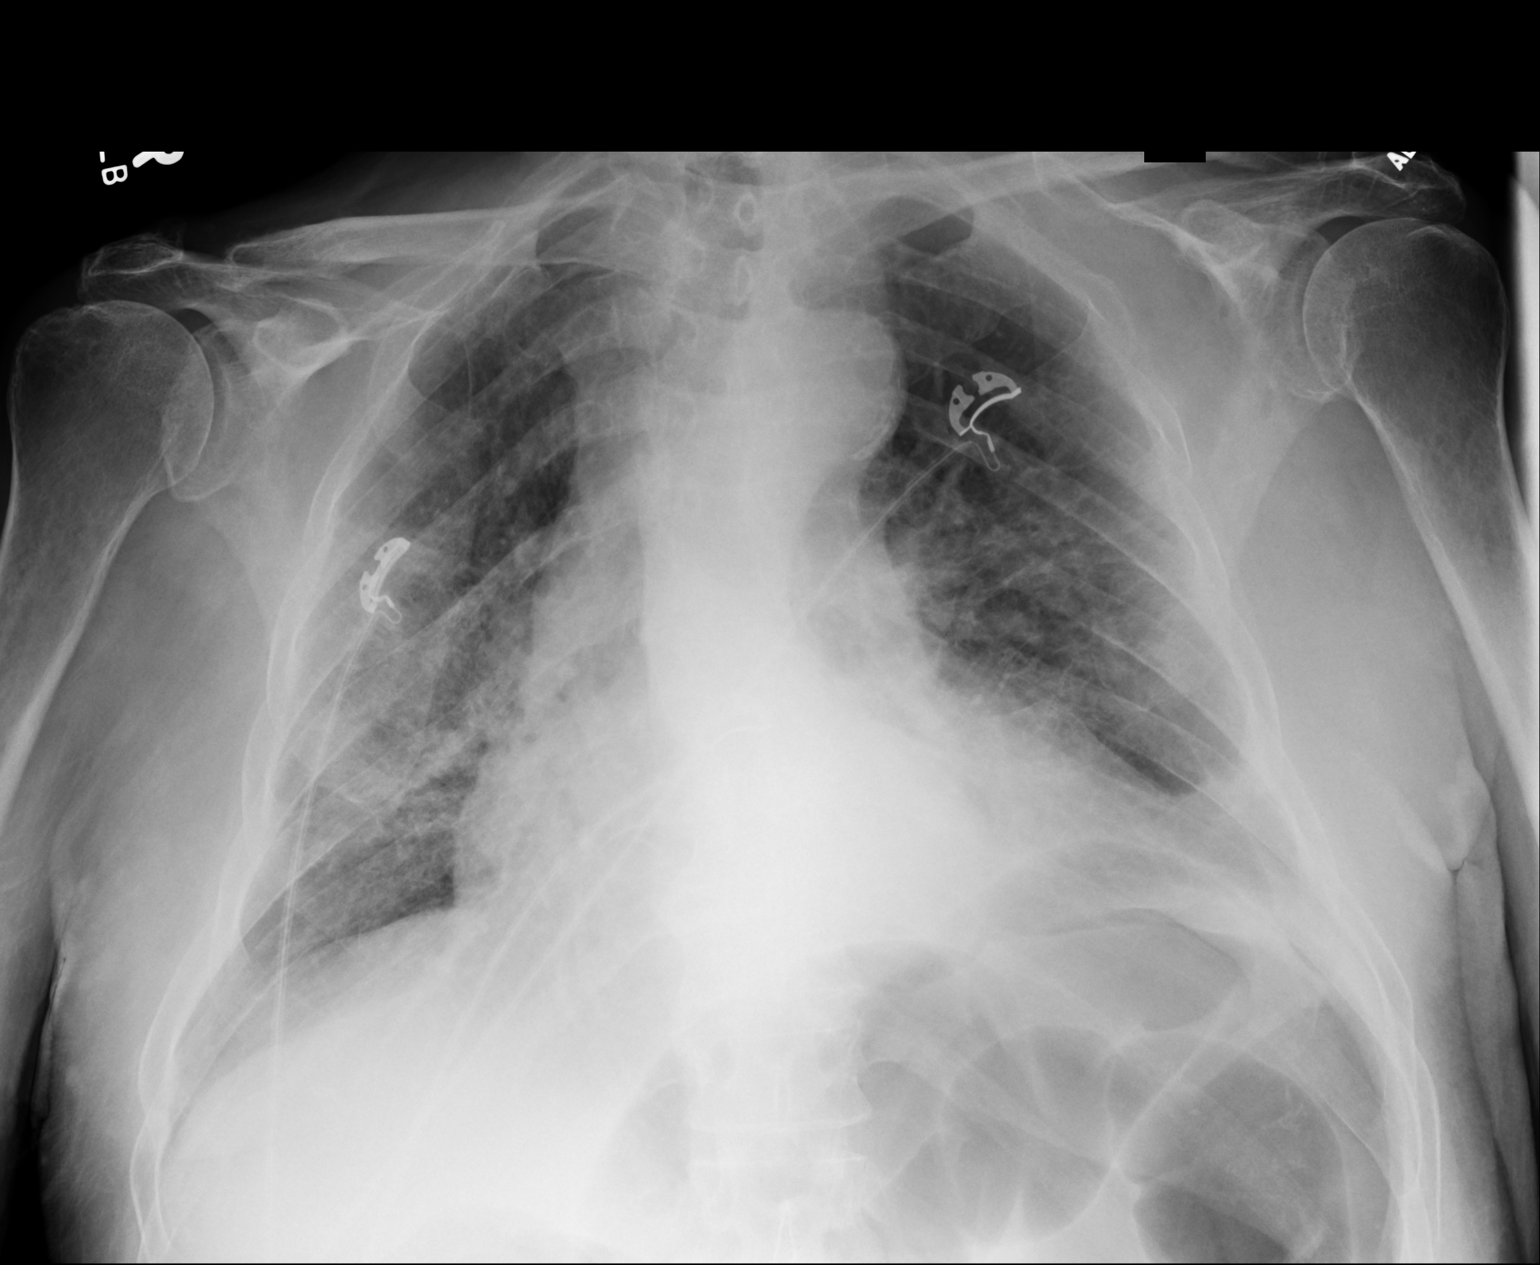

[1 of 1 positions shown; findings below may reference images not displayed]

FINDINGS: Exam detail is diminished due to low lung volumes and portable
technique.

The heart size is enlarged. Tortuous thoracic aorta containing
calcified atherosclerotic plaque. There is bilateral interstitial
and airspace opacities, new from previous exam.
IMPRESSION: 1. Bilateral pulmonary opacities which may represent changes of CHF
or multifocal infection.
2. Cardiac enlargement and atherosclerotic disease.

## 2015-06-30 IMAGING — US US EXTREM UP VENOUS*L*
1 series · 13 of 24 positions shown · non-contrast
Comparison: None.

CLINICAL DATA: 80-year-old male with left upper extremity swelling
and pain.



[Series 1: us extrem up venous*left* · 0.07mm/px · 13 of 37 slices shown]
[im 1/37]
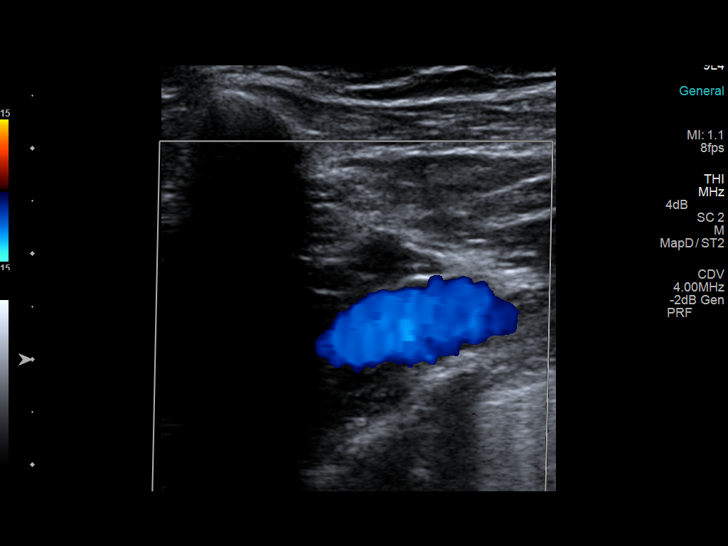
[im 4/37]
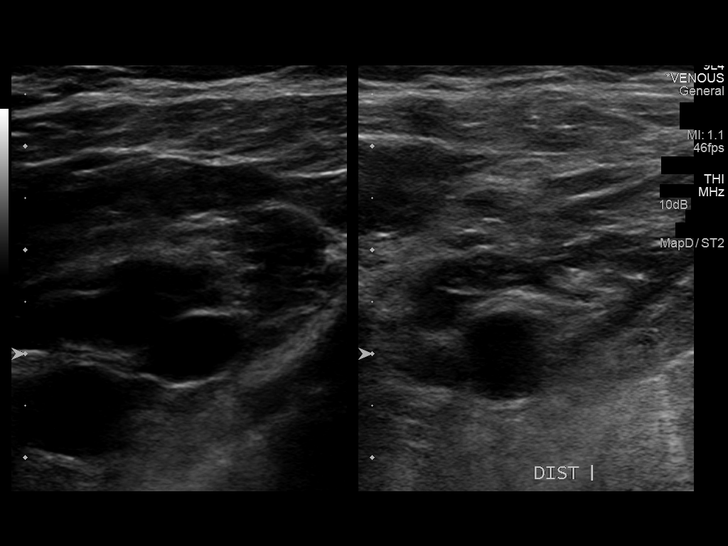
[im 7/37]
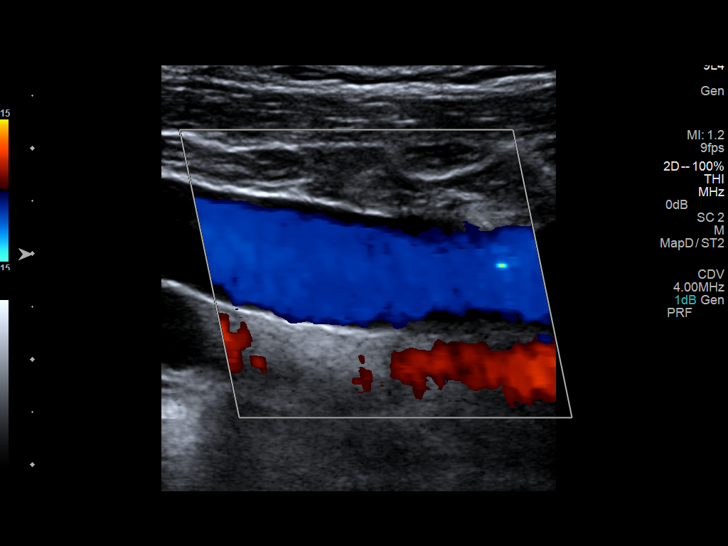
[im 10/37]
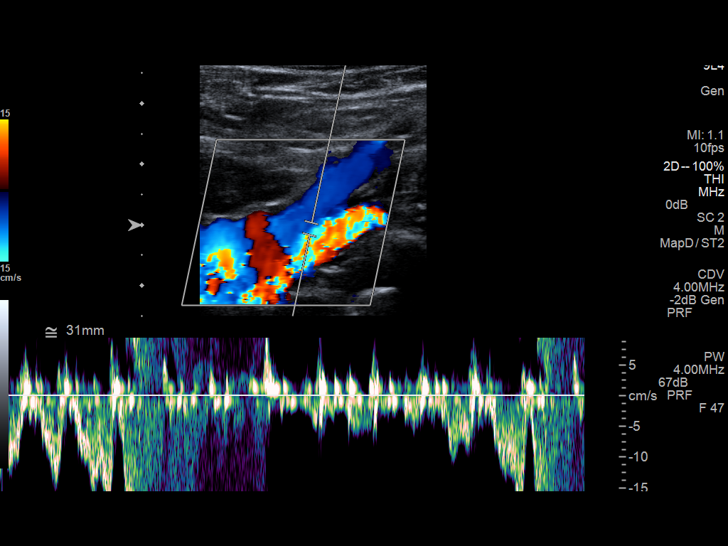
[im 13/37]
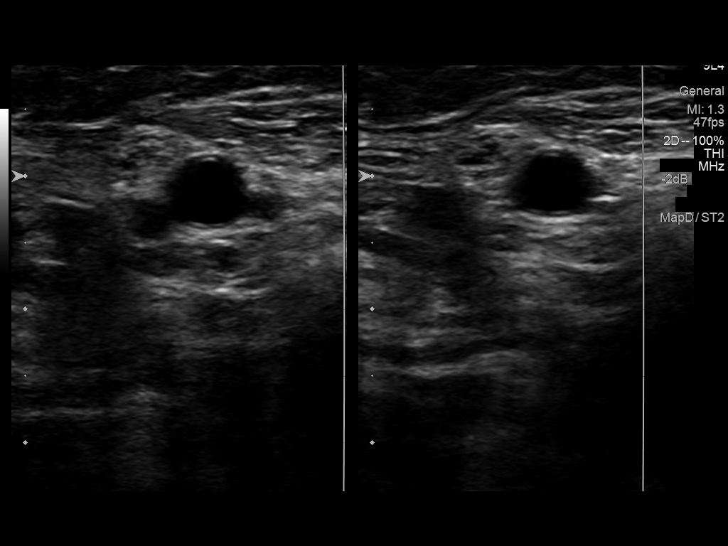
[im 16/37]
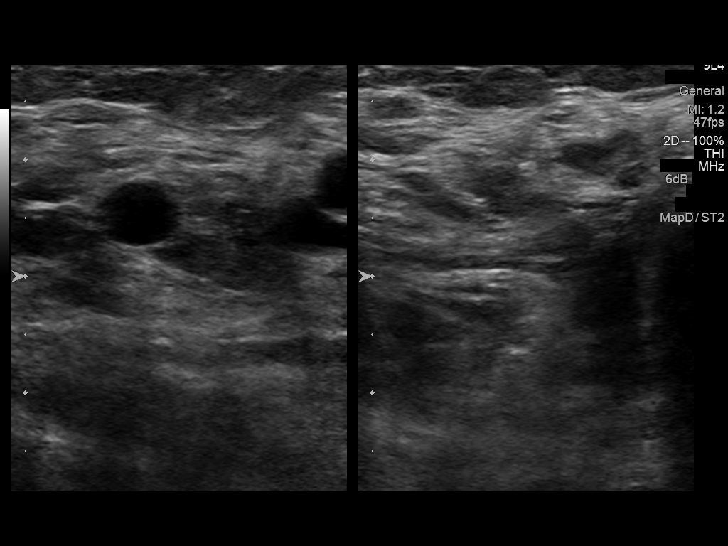
[im 19/37]
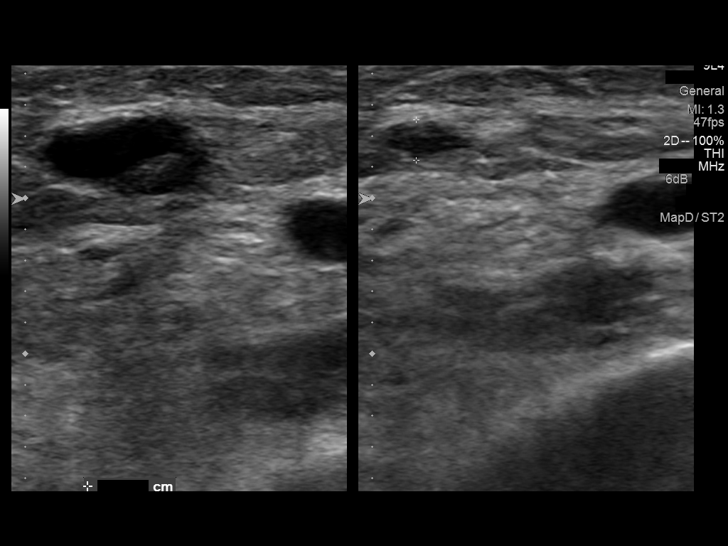
[im 21/37]
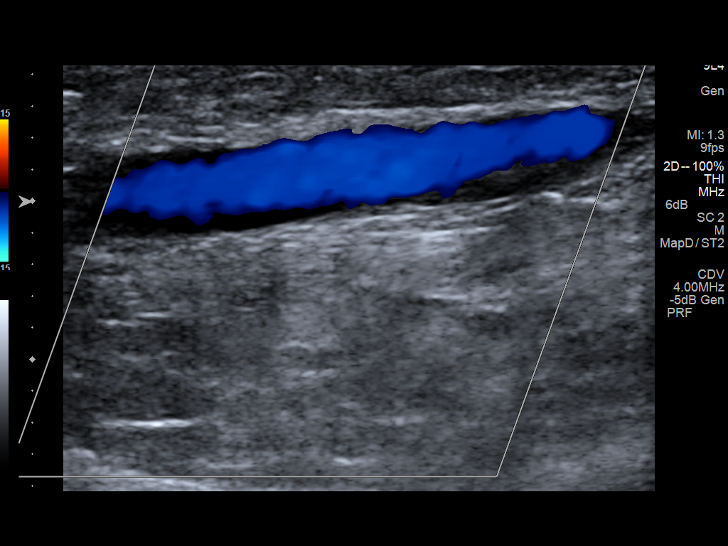
[im 24/37]
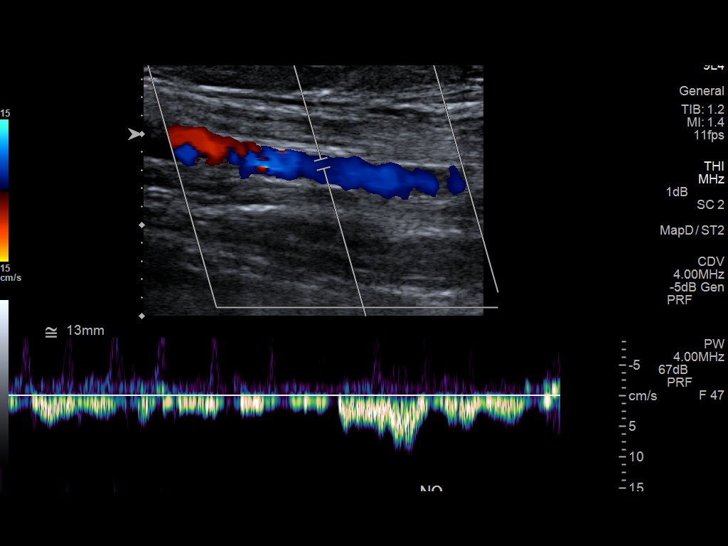
[im 27/37]
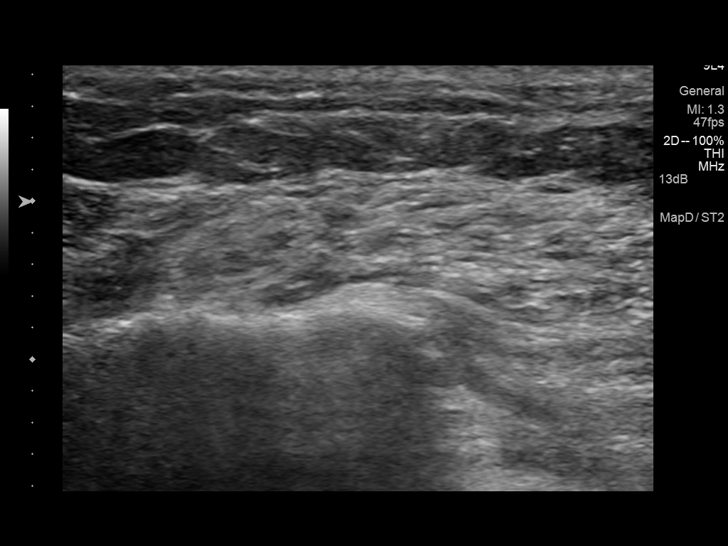
[im 30/37]
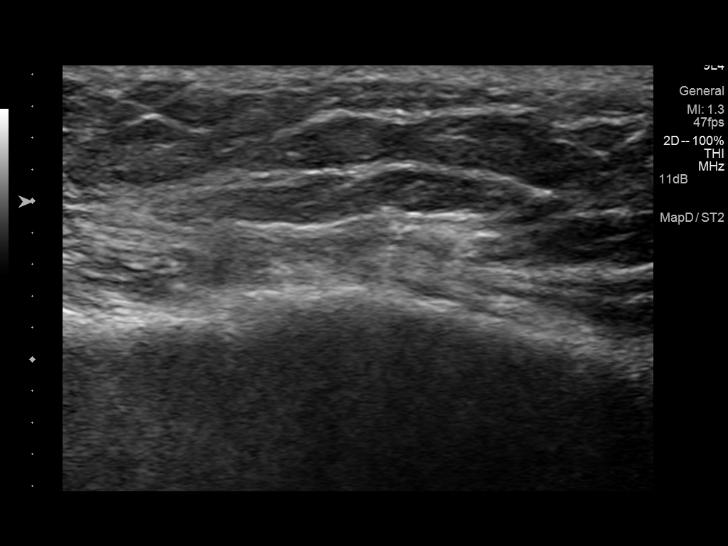
[im 33/37]
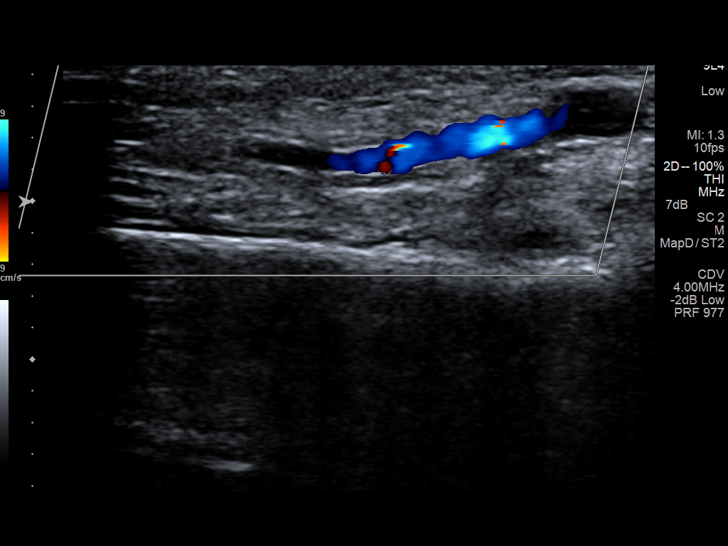
[im 37/37]
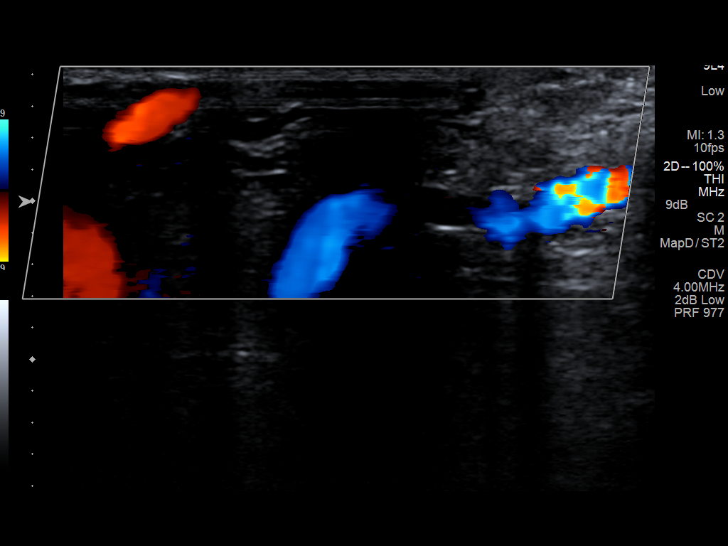

[13 of 24 positions shown; findings below may reference images not displayed]

FINDINGS: Contralateral Subclavian Vein: Respiratory phasicity is normal and
symmetric with the symptomatic side. No evidence of thrombus. Normal
compressibility.

Internal Jugular Vein: No evidence of thrombus. Normal
compressibility, respiratory phasicity and response to augmentation.

Subclavian Vein: No evidence of thrombus. Normal compressibility,
respiratory phasicity and response to augmentation.

Axillary Vein: No evidence of thrombus. Normal compressibility,
respiratory phasicity and response to augmentation.

Cephalic Vein: Not visualized

Basilic Vein: Nonocclusive thrombus is identified within the basilic
vein.

Brachial Veins: No evidence of thrombus. Normal compressibility,
respiratory phasicity and response to augmentation.

Radial Veins: No evidence of thrombus. Normal compressibility,
respiratory phasicity and response to augmentation.

Ulnar Veins: No evidence of thrombus. Normal compressibility,
respiratory phasicity and response to augmentation.

Venous Reflux:  None visualized.

Other Findings:  None visualized.
IMPRESSION: Nonocclusive thrombus within the basilic vein, a superficial vein.

No evidence of left upper extremity DVT.
